# Patient Record
Sex: Male | Born: 1940
Health system: Southern US, Community
[De-identification: ages and names within clinical notes are randomized; demographics above are authoritative.]

## PROBLEM LIST (undated history)

## (undated) DIAGNOSIS — N400 Enlarged prostate without lower urinary tract symptoms: Secondary | ICD-10-CM

## (undated) DIAGNOSIS — I10 Essential (primary) hypertension: Secondary | ICD-10-CM

## (undated) DIAGNOSIS — K219 Gastro-esophageal reflux disease without esophagitis: Secondary | ICD-10-CM

## (undated) DIAGNOSIS — E119 Type 2 diabetes mellitus without complications: Secondary | ICD-10-CM

## (undated) DIAGNOSIS — N200 Calculus of kidney: Secondary | ICD-10-CM

## (undated) DIAGNOSIS — J349 Unspecified disorder of nose and nasal sinuses: Secondary | ICD-10-CM

## (undated) DIAGNOSIS — E785 Hyperlipidemia, unspecified: Secondary | ICD-10-CM

## (undated) HISTORY — PX: LITHOTRIPSY: SUR834

---

## 1998-03-19 ENCOUNTER — Encounter: Payer: Self-pay | Admitting: Urology

## 1998-03-19 ENCOUNTER — Emergency Department (HOSPITAL_COMMUNITY): Admission: EM | Admit: 1998-03-19 | Discharge: 1998-03-19 | Payer: Self-pay | Admitting: Emergency Medicine

## 1998-03-19 ENCOUNTER — Ambulatory Visit (HOSPITAL_COMMUNITY): Admission: RE | Admit: 1998-03-19 | Discharge: 1998-03-19 | Payer: Self-pay | Admitting: Urology

## 1998-07-02 ENCOUNTER — Ambulatory Visit (HOSPITAL_COMMUNITY): Admission: RE | Admit: 1998-07-02 | Discharge: 1998-07-02 | Payer: Self-pay | Admitting: Urology

## 1998-07-02 ENCOUNTER — Encounter: Payer: Self-pay | Admitting: Urology

## 2001-05-03 ENCOUNTER — Encounter: Payer: Self-pay | Admitting: Urology

## 2001-05-03 ENCOUNTER — Ambulatory Visit (HOSPITAL_BASED_OUTPATIENT_CLINIC_OR_DEPARTMENT_OTHER): Admission: RE | Admit: 2001-05-03 | Discharge: 2001-05-03 | Payer: Self-pay | Admitting: Urology

## 2001-06-14 ENCOUNTER — Encounter: Payer: Self-pay | Admitting: Urology

## 2001-06-14 ENCOUNTER — Ambulatory Visit (HOSPITAL_BASED_OUTPATIENT_CLINIC_OR_DEPARTMENT_OTHER): Admission: RE | Admit: 2001-06-14 | Discharge: 2001-06-14 | Payer: Self-pay | Admitting: Urology

## 2002-12-30 ENCOUNTER — Encounter: Admission: RE | Admit: 2002-12-30 | Discharge: 2002-12-30 | Payer: Self-pay | Admitting: Internal Medicine

## 2004-02-09 ENCOUNTER — Encounter: Admission: RE | Admit: 2004-02-09 | Discharge: 2004-05-09 | Payer: Self-pay | Admitting: Internal Medicine

## 2005-02-07 ENCOUNTER — Emergency Department (HOSPITAL_COMMUNITY): Admission: EM | Admit: 2005-02-07 | Discharge: 2005-02-08 | Payer: Self-pay | Admitting: Emergency Medicine

## 2005-03-01 ENCOUNTER — Encounter: Admission: RE | Admit: 2005-03-01 | Discharge: 2005-03-01 | Payer: Self-pay | Admitting: Internal Medicine

## 2005-03-02 ENCOUNTER — Encounter: Admission: RE | Admit: 2005-03-02 | Discharge: 2005-03-02 | Payer: Self-pay | Admitting: Pediatrics

## 2011-02-02 DIAGNOSIS — E119 Type 2 diabetes mellitus without complications: Secondary | ICD-10-CM | POA: Diagnosis not present

## 2011-05-03 DIAGNOSIS — E119 Type 2 diabetes mellitus without complications: Secondary | ICD-10-CM | POA: Diagnosis not present

## 2011-05-05 DIAGNOSIS — H25049 Posterior subcapsular polar age-related cataract, unspecified eye: Secondary | ICD-10-CM | POA: Diagnosis not present

## 2011-06-17 DIAGNOSIS — N401 Enlarged prostate with lower urinary tract symptoms: Secondary | ICD-10-CM | POA: Diagnosis not present

## 2011-06-29 DIAGNOSIS — N401 Enlarged prostate with lower urinary tract symptoms: Secondary | ICD-10-CM | POA: Diagnosis not present

## 2011-06-29 DIAGNOSIS — N529 Male erectile dysfunction, unspecified: Secondary | ICD-10-CM | POA: Diagnosis not present

## 2011-11-04 DIAGNOSIS — I10 Essential (primary) hypertension: Secondary | ICD-10-CM | POA: Diagnosis not present

## 2011-11-04 DIAGNOSIS — E119 Type 2 diabetes mellitus without complications: Secondary | ICD-10-CM | POA: Diagnosis not present

## 2011-11-04 DIAGNOSIS — E7800 Pure hypercholesterolemia, unspecified: Secondary | ICD-10-CM | POA: Diagnosis not present

## 2011-11-15 DIAGNOSIS — E7800 Pure hypercholesterolemia, unspecified: Secondary | ICD-10-CM | POA: Diagnosis not present

## 2011-11-15 DIAGNOSIS — I10 Essential (primary) hypertension: Secondary | ICD-10-CM | POA: Diagnosis not present

## 2011-11-15 DIAGNOSIS — Z23 Encounter for immunization: Secondary | ICD-10-CM | POA: Diagnosis not present

## 2011-11-15 DIAGNOSIS — E119 Type 2 diabetes mellitus without complications: Secondary | ICD-10-CM | POA: Diagnosis not present

## 2012-02-01 HISTORY — PX: OTHER SURGICAL HISTORY: SHX169

## 2012-05-10 DIAGNOSIS — E7800 Pure hypercholesterolemia, unspecified: Secondary | ICD-10-CM | POA: Diagnosis not present

## 2012-05-10 DIAGNOSIS — E119 Type 2 diabetes mellitus without complications: Secondary | ICD-10-CM | POA: Diagnosis not present

## 2012-05-10 DIAGNOSIS — I10 Essential (primary) hypertension: Secondary | ICD-10-CM | POA: Diagnosis not present

## 2012-05-23 DIAGNOSIS — H25049 Posterior subcapsular polar age-related cataract, unspecified eye: Secondary | ICD-10-CM | POA: Diagnosis not present

## 2012-05-30 DIAGNOSIS — R0989 Other specified symptoms and signs involving the circulatory and respiratory systems: Secondary | ICD-10-CM | POA: Diagnosis not present

## 2012-05-30 DIAGNOSIS — R0609 Other forms of dyspnea: Secondary | ICD-10-CM | POA: Diagnosis not present

## 2012-06-19 DIAGNOSIS — H2510 Age-related nuclear cataract, unspecified eye: Secondary | ICD-10-CM | POA: Diagnosis not present

## 2012-06-29 DIAGNOSIS — N401 Enlarged prostate with lower urinary tract symptoms: Secondary | ICD-10-CM | POA: Diagnosis not present

## 2012-07-16 DIAGNOSIS — H2510 Age-related nuclear cataract, unspecified eye: Secondary | ICD-10-CM | POA: Diagnosis not present

## 2012-07-16 DIAGNOSIS — H25049 Posterior subcapsular polar age-related cataract, unspecified eye: Secondary | ICD-10-CM | POA: Diagnosis not present

## 2012-07-24 DIAGNOSIS — H2510 Age-related nuclear cataract, unspecified eye: Secondary | ICD-10-CM | POA: Diagnosis not present

## 2012-07-30 DIAGNOSIS — H25049 Posterior subcapsular polar age-related cataract, unspecified eye: Secondary | ICD-10-CM | POA: Diagnosis not present

## 2012-07-30 DIAGNOSIS — H2510 Age-related nuclear cataract, unspecified eye: Secondary | ICD-10-CM | POA: Diagnosis not present

## 2012-08-08 DIAGNOSIS — E7800 Pure hypercholesterolemia, unspecified: Secondary | ICD-10-CM | POA: Diagnosis not present

## 2012-08-08 DIAGNOSIS — E119 Type 2 diabetes mellitus without complications: Secondary | ICD-10-CM | POA: Diagnosis not present

## 2012-08-14 DIAGNOSIS — N400 Enlarged prostate without lower urinary tract symptoms: Secondary | ICD-10-CM | POA: Diagnosis not present

## 2012-08-14 DIAGNOSIS — I10 Essential (primary) hypertension: Secondary | ICD-10-CM | POA: Diagnosis not present

## 2012-08-14 DIAGNOSIS — E119 Type 2 diabetes mellitus without complications: Secondary | ICD-10-CM | POA: Diagnosis not present

## 2013-01-11 DIAGNOSIS — E119 Type 2 diabetes mellitus without complications: Secondary | ICD-10-CM | POA: Diagnosis not present

## 2013-02-11 DIAGNOSIS — E7800 Pure hypercholesterolemia, unspecified: Secondary | ICD-10-CM | POA: Diagnosis not present

## 2013-02-11 DIAGNOSIS — E119 Type 2 diabetes mellitus without complications: Secondary | ICD-10-CM | POA: Diagnosis not present

## 2013-02-14 DIAGNOSIS — N400 Enlarged prostate without lower urinary tract symptoms: Secondary | ICD-10-CM | POA: Diagnosis not present

## 2013-02-14 DIAGNOSIS — E785 Hyperlipidemia, unspecified: Secondary | ICD-10-CM | POA: Diagnosis not present

## 2013-02-14 DIAGNOSIS — I10 Essential (primary) hypertension: Secondary | ICD-10-CM | POA: Diagnosis not present

## 2013-02-14 DIAGNOSIS — E119 Type 2 diabetes mellitus without complications: Secondary | ICD-10-CM | POA: Diagnosis not present

## 2013-07-18 DIAGNOSIS — N401 Enlarged prostate with lower urinary tract symptoms: Secondary | ICD-10-CM | POA: Diagnosis not present

## 2013-07-18 DIAGNOSIS — N139 Obstructive and reflux uropathy, unspecified: Secondary | ICD-10-CM | POA: Diagnosis not present

## 2013-07-26 DIAGNOSIS — N139 Obstructive and reflux uropathy, unspecified: Secondary | ICD-10-CM | POA: Diagnosis not present

## 2013-07-26 DIAGNOSIS — N401 Enlarged prostate with lower urinary tract symptoms: Secondary | ICD-10-CM | POA: Diagnosis not present

## 2013-08-07 DIAGNOSIS — I10 Essential (primary) hypertension: Secondary | ICD-10-CM | POA: Diagnosis not present

## 2013-08-07 DIAGNOSIS — T6391XA Toxic effect of contact with unspecified venomous animal, accidental (unintentional), initial encounter: Secondary | ICD-10-CM | POA: Diagnosis not present

## 2013-08-09 DIAGNOSIS — E119 Type 2 diabetes mellitus without complications: Secondary | ICD-10-CM | POA: Diagnosis not present

## 2013-08-09 DIAGNOSIS — N400 Enlarged prostate without lower urinary tract symptoms: Secondary | ICD-10-CM | POA: Diagnosis not present

## 2013-11-21 DIAGNOSIS — J208 Acute bronchitis due to other specified organisms: Secondary | ICD-10-CM | POA: Diagnosis not present

## 2014-01-20 DIAGNOSIS — E119 Type 2 diabetes mellitus without complications: Secondary | ICD-10-CM | POA: Diagnosis not present

## 2014-02-11 DIAGNOSIS — R109 Unspecified abdominal pain: Secondary | ICD-10-CM | POA: Diagnosis not present

## 2014-02-11 DIAGNOSIS — N200 Calculus of kidney: Secondary | ICD-10-CM | POA: Diagnosis not present

## 2014-02-11 DIAGNOSIS — N202 Calculus of kidney with calculus of ureter: Secondary | ICD-10-CM | POA: Diagnosis not present

## 2014-02-11 DIAGNOSIS — N201 Calculus of ureter: Secondary | ICD-10-CM | POA: Diagnosis not present

## 2014-02-11 DIAGNOSIS — K8020 Calculus of gallbladder without cholecystitis without obstruction: Secondary | ICD-10-CM | POA: Diagnosis not present

## 2014-02-11 DIAGNOSIS — R312 Other microscopic hematuria: Secondary | ICD-10-CM | POA: Diagnosis not present

## 2014-02-11 DIAGNOSIS — R110 Nausea: Secondary | ICD-10-CM | POA: Diagnosis not present

## 2014-02-11 DIAGNOSIS — N1330 Unspecified hydronephrosis: Secondary | ICD-10-CM | POA: Diagnosis not present

## 2014-02-12 ENCOUNTER — Other Ambulatory Visit: Payer: Self-pay | Admitting: Urology

## 2014-02-12 DIAGNOSIS — E780 Pure hypercholesterolemia: Secondary | ICD-10-CM | POA: Diagnosis not present

## 2014-02-12 DIAGNOSIS — E118 Type 2 diabetes mellitus with unspecified complications: Secondary | ICD-10-CM | POA: Diagnosis not present

## 2014-02-12 DIAGNOSIS — I10 Essential (primary) hypertension: Secondary | ICD-10-CM | POA: Diagnosis not present

## 2014-02-14 NOTE — Patient Instructions (Addendum)
Ian [name]  02/14/2014   Your procedure is scheduled on: 02/20/14   Report to [name] Main  Entrance and follow signs to               [place] at 5:15 AM.   Call this number if you have problems the morning of surgery [phone]   Remember:  Do not eat food or drink liquids :After Midnight.     Take these medicines the morning of surgery with A SIP OF WATER: FINASTERIDE / MAY TAKE CLARITIN IF NEEDED                               You may not have any metal on your body including hair pins and              piercings  Do not wear jewelry, make-up, lotions, powders or perfumes.             Do not wear nail polish.  Do not shave  48 hours prior to surgery.              Men may shave face and neck.   Do not bring valuables to the hospital. [name].  Contacts, dentures or bridgework may not be worn into surgery.  Leave suitcase in the car. After surgery it may be brought to your room.     Patients discharged the day of surgery will not be allowed to drive home.  Name and phone number of your driver:  Special Instructions: N/A              Please read over the following fact sheets you were given: _____________________________________________________________________                                                     [name]  Before surgery, you can play an important role.  Because skin is not sterile, your skin needs to be as free of germs as possible.  You can reduce the number of germs on your skin by washing with CHG (chlorahexidine gluconate) soap before surgery.  CHG is an antiseptic cleaner which kills germs and bonds with the skin to continue killing germs even after washing. Please DO NOT use if you have an allergy to CHG or antibacterial soaps.  If your skin becomes reddened/irritated stop using the CHG and inform your nurse when you arrive at Short  Stay. Do not shave (including legs and underarms) for at least 48 hours prior to the first CHG shower.  You may shave your face. Please follow these instructions carefully:   1.  Shower with CHG Soap the night before surgery and the  morning of Surgery.   2.  If you choose to wash your hair, wash your hair first as usual with your  normal  Shampoo.   3.  After you shampoo, rinse your hair and body thoroughly to remove the  shampoo.  4.  Use CHG as you would any other liquid soap.  You can apply chg directly  to the skin and wash . Gently wash with scrungie or clean wascloth    5.  Apply the CHG Soap to your body ONLY FROM THE NECK DOWN.   Do not use on open                           Wound or open sores. Avoid contact with eyes, ears mouth and genitals (private parts).                        Genitals (private parts) with your normal soap.              6.  Wash thoroughly, paying special attention to the area where your surgery  will be performed.   7.  Thoroughly rinse your body with warm water from the neck down.   8.  DO NOT shower/wash with your normal soap after using and rinsing off  the CHG Soap .                9.  Pat yourself dry with a clean towel.             10.  Wear clean pajamas.             11.  Place clean sheets on your bed the night of your first shower and do not  sleep with pets.  Day of Surgery : Do not apply any lotions/deodorants the morning of surgery.  Please wear clean clothes to the hospital/surgery center.  FAILURE TO FOLLOW THESE INSTRUCTIONS MAY RESULT IN THE CANCELLATION OF YOUR SURGERY    PATIENT SIGNATURE_________________________________  ______________________________________________________________________

## 2014-02-17 ENCOUNTER — Other Ambulatory Visit: Payer: Self-pay

## 2014-02-17 ENCOUNTER — Encounter (HOSPITAL_COMMUNITY)
Admission: RE | Admit: 2014-02-17 | Discharge: 2014-02-17 | Disposition: A | Discharge status: Home / Self Care | Payer: Medicare Other | Source: Ambulatory Visit | Attending: Urology | Admitting: Urology

## 2014-02-17 ENCOUNTER — Encounter (HOSPITAL_COMMUNITY): Payer: Self-pay

## 2014-02-17 ENCOUNTER — Ambulatory Visit (HOSPITAL_COMMUNITY)
Admission: RE | Admit: 2014-02-17 | Discharge: 2014-02-17 | Disposition: A | Discharge status: Home / Self Care | Payer: Medicare Other | Source: Ambulatory Visit | Attending: Urology | Admitting: Urology

## 2014-02-17 DIAGNOSIS — Z01818 Encounter for other preprocedural examination: Secondary | ICD-10-CM | POA: Diagnosis not present

## 2014-02-17 DIAGNOSIS — I10 Essential (primary) hypertension: Secondary | ICD-10-CM

## 2014-02-17 DIAGNOSIS — N200 Calculus of kidney: Secondary | ICD-10-CM | POA: Diagnosis not present

## 2014-02-17 HISTORY — DX: Benign prostatic hyperplasia without lower urinary tract symptoms: N40.0

## 2014-02-17 HISTORY — DX: Unspecified disorder of nose and nasal sinuses: J34.9

## 2014-02-17 HISTORY — DX: Calculus of kidney: N20.0

## 2014-02-17 HISTORY — DX: Type 2 diabetes mellitus without complications: E11.9

## 2014-02-17 HISTORY — DX: Gastro-esophageal reflux disease without esophagitis: K21.9

## 2014-02-17 HISTORY — DX: Essential (primary) hypertension: I10

## 2014-02-17 HISTORY — DX: Hyperlipidemia, unspecified: E78.5

## 2014-02-17 LAB — CBC
HCT: 42.4 % (ref 39.0–52.0)
Hemoglobin: 14.2 g/dL (ref 13.0–17.0)
MCH: 30.6 pg (ref 26.0–34.0)
MCHC: 33.5 g/dL (ref 30.0–36.0)
MCV: 91.4 fL (ref 78.0–100.0)
PLATELETS: 184 10*3/uL (ref 150–400)
RBC: 4.64 MIL/uL (ref 4.22–5.81)
RDW: 12.7 % (ref 11.5–15.5)
WBC: 8.5 10*3/uL (ref 4.0–10.5)

## 2014-02-17 LAB — BASIC METABOLIC PANEL
Anion gap: 7 (ref 5–15)
BUN: 19 mg/dL (ref 6–23)
CO2: 27 mmol/L (ref 19–32)
Calcium: 9.7 mg/dL (ref 8.4–10.5)
Chloride: 106 mEq/L (ref 96–112)
Creatinine, Ser: 0.89 mg/dL (ref 0.50–1.35)
GFR calc Af Amer: >90 mL/min (ref >90)
GFR calc non Af Amer: 83 mL/min — ABNORMAL LOW (ref >90)
GLUCOSE: 101 mg/dL — AB (ref 70–99)
POTASSIUM: 4 mmol/L (ref 3.5–5.1)
SODIUM: 140 mmol/L (ref 135–145)

## 2014-02-17 NOTE — Progress Notes (Signed)
   02/17/14 0927  OBSTRUCTIVE SLEEP APNEA  Have you ever been diagnosed with sleep apnea through a sleep study? No  Do you snore loudly (loud enough to be heard through closed doors)?  0  Do you often feel tired, fatigued, or sleepy during the daytime? 0  Has anyone observed you stop breathing during your sleep? 1  Do you have, or are you being treated for high blood pressure? 1  BMI more than 35 kg/m2? 0  Age over 74 years old? 1  Neck circumference greater than 40 cm/16 inches? 1  Gender: 1  Obstructive Sleep Apnea Score 5  Score 4 or greater  Results sent to PCP

## 2014-02-18 DIAGNOSIS — E1165 Type 2 diabetes mellitus with hyperglycemia: Secondary | ICD-10-CM | POA: Diagnosis not present

## 2014-02-18 DIAGNOSIS — Z23 Encounter for immunization: Secondary | ICD-10-CM | POA: Diagnosis not present

## 2014-02-18 DIAGNOSIS — E780 Pure hypercholesterolemia: Secondary | ICD-10-CM | POA: Diagnosis not present

## 2014-02-18 DIAGNOSIS — I10 Essential (primary) hypertension: Secondary | ICD-10-CM | POA: Diagnosis not present

## 2014-02-19 NOTE — Anesthesia Preprocedure Evaluation (Signed)
Anesthesia Evaluation  Patient identified by MRN, date of birth, ID band Patient awake    Reviewed: Allergy & Precautions, NPO status , Patient's Chart, lab work & pertinent test results  History of Anesthesia Complications Negative for: history of anesthetic complications  Airway Mallampati: II  TM Distance: >3 FB Neck ROM: Full    Dental no notable dental hx. (+) Dental Advisory Given   Pulmonary neg pulmonary ROS,  breath sounds clear to auscultation  Pulmonary exam normal       Cardiovascular hypertension, Pt. on medications Rhythm:Regular Rate:Normal     Neuro/Psych negative neurological ROS  negative psych ROS   GI/Hepatic Neg liver ROS, GERD-  Medicated and Controlled,  Endo/Other  diabetes  Renal/GU Renal disease  negative genitourinary   Musculoskeletal negative musculoskeletal ROS (+)   Abdominal   Peds negative pediatric ROS (+)  Hematology negative hematology ROS (+)   Anesthesia Other Findings   Reproductive/Obstetrics negative OB ROS                             Anesthesia Physical Anesthesia Plan  ASA: II  Anesthesia Plan: General   Post-op Pain Management:    Induction: Intravenous  Airway Management Planned: LMA  Additional Equipment:   Intra-op Plan:   Post-operative Plan: Extubation in OR  Informed Consent: I have reviewed the patients History and Physical, chart, labs and discussed the procedure including the risks, benefits and alternatives for the proposed anesthesia with the patient or authorized representative who has indicated his/her understanding and acceptance.   Dental advisory given  Plan Discussed with: CRNA  Anesthesia Plan Comments:         Anesthesia Quick Evaluation

## 2014-02-19 NOTE — H&P (Signed)
Reason For Visit Seen today as a work-in for (L) flank pain.   Active Problems Problems  1. Calculus of left ureter (N20.1)   Assessed By: Jimmey Ralph (Urology); Last Assessed: 11 Feb 2014 2. Left flank pain (R10.9)   Assessed By: Jimmey Ralph (Urology); Last Assessed: 11 Feb 2014 3. Microscopic hematuria (R31.2)   Assessed By: Jimmey Ralph (Urology); Last Assessed: 11 Feb 2014 4. Nausea (R11.0)   Assessed By: Jimmey Ralph (Urology); Last Assessed: 11 Feb 2014 Ian. Nephrolithiasis (N20.0)   Assessed By: Jimmey Ralph (Urology); Last Assessed: 11 Feb 2014  History of Present Illness 74 YO male Ian [name] of Dr. Lynne Logan seen today as a work-in for (L) flank pain.     GU Hx:    1) BPH/LUTS: He has been treated successfully with combination medical therapy. He did try to discontinue his alpha blocker in 2009 but found his symptoms were significantly worse without this medication.   Current treatment: Flomax, Finasteride    2) Prostate cancer screening:  Last PSA: 0.6 (May 2014) -- on finasteride    3) Erectile dysfunction:  Current treatment: Cialis 20 mg    4) Questionable testosterone deficiency: He developed symptoms in 2009 including increased fatigue and loss of muscle mass. His libido was preserved. His testosterone at that time was normal at 368 ng/dl.    Interval history June 2015:    He follows up for further evaluation of his BPH and voiding symptoms as well as for prostate cancer screening. He continues to take finasteride and tamsulosin. He did temporarily need to stop tamsulosin when he was on back order and we substituted Rapaflo which he actually found to be less effective than tamsulosin. Currently, his symptoms remain subjectively stable over the past year. IPSS today is 11.     Jan 2016 Interval Hx:   Last pm stated "he just didn't feel well." Self treated with OTC Ibuprofen 200 mg and this am was awaken with (L) flank pain radiating into  (L) abdomen with mild nausea. Pain now 4-Ian/10. Denies hematuria or f/c.     Has hx of stones but states it has been years since last stone activity.   Past Medical History Problems  1. Benign localized prostatic hyperplasia with lower urinary tract symptoms (LUTS) (N40.1) 2. History of diabetes mellitus (Z86.39) 3. History of hypercholesterolemia (Z86.39) 4. History of hypertension (Z86.79) Ian. Nephrolithiasis (N20.0)  Surgical History Problems  1. History of Lithotripsy  Current Meds 1. Aspirin 81 MG Oral Tablet;  Therapy: (Recorded:29Jan2008) to Recorded 2. Cialis 20 MG Oral Tablet; TAKE 1 TABLET As Directed;  Therapy: 04Apr2012 to (Last Rx:04Apr2012) Ordered 3. Claritin 10 MG Oral Tablet;  Therapy: (Recorded:29Jan2008) to Recorded 4. Crestor TABS;  Therapy: (Recorded:29Jul2008) to Recorded Ian. Finasteride Ian MG Oral Tablet; TAKE 1 TABLET DAILY AS DIRECTED;  Therapy: 29Jan2008 to (Evaluate:18Sep2016)  Requested for: [id number]; Last  [id number] Ordered 6. Lisinopril 10 MG Oral Tablet;  Therapy: [id number] to Recorded 7. MetFORMIN HCl - 500 MG Oral Tablet;  Therapy: 25Mar2012 to Recorded 8. Pataday 0.2 % Ophthalmic Solution;  Therapy: [id number] to Recorded 9. Tamsulosin HCl - 0.4 MG Oral Capsule; TAKE 1 CAPSULE AT BEDTIME;  Therapy: [id number] to (Evaluate:30Sep2016)  Requested for: [id number]; Last  Rx:04Jan2016 Ordered  Allergies Medication  1. Lipitor TABS  Family History Problems  1. No pertinent family history : Mother  Social History Problems  1. Denied: History of Alcohol Use 2. Marital History - Currently Married 3. Never A Smoker 4. Occupation:  retired Ian. Denied: History of Tobacco Use  Review of Systems Genitourinary, constitutional, skin, eye, otolaryngeal, hematologic/lymphatic, cardiovascular, pulmonary, endocrine, musculoskeletal, gastrointestinal, neurological and psychiatric system(s) were reviewed and pertinent findings if present are noted  and are otherwise negative.  Gastrointestinal: nausea, flank pain and abdominal pain.    Vitals Vital Signs [Data Includes: Last 1 Day]  Recorded: 12Jan2016 11:27AM  Height: Ian ft 11 in Weight: 168 lb  BMI Calculated: 23.43 BSA Calculated: 1.96 Blood Pressure: 138 / 68 Temperature: 97.9 F Heart Rate: 96  Physical Exam Constitutional: Well nourished and well developed . No acute distress. The Ian [name] appears well hydrated.  Abdomen: The abdomen is flat. The abdomen is soft and nontender. No tenderness in the RLQ and no LLQ tenderness. No CVA tenderness.  Skin: Normal skin turgor.  Neuro/Psych:. Mood and affect are appropriate.    Results/Data Urine [Data Includes: Last 1 Day]   [id number]  COLOR YELLOW   APPEARANCE CLEAR   SPECIFIC GRAVITY 1.020   pH 6.0   GLUCOSE NEG mg/dL  BILIRUBIN NEG   KETONE NEG mg/dL  BLOOD LARGE   PROTEIN NEG mg/dL  UROBILINOGEN 0.2 mg/dL  NITRITE NEG   LEUKOCYTE ESTERASE NEG   SQUAMOUS EPITHELIAL/HPF RARE   WBC NONE SEEN WBC/hpf  RBC 21-50 RBC/hpf  BACTERIA NONE SEEN   CRYSTALS NONE SEEN   CASTS NONE SEEN    The following images/tracing/specimen were independently visualized:  CT urogram: bilateral renal calculi with stone burden L>R. Has bilateral perinephric stranding. Mild left hydronephrosis and hydroureter noted. There are 2 stones noted along left ureter (1) 4 mm mid ureter (2) >10 mm UVJ calculus noted.  The following clinical lab reports were reviewed:  UA- significant microscopic hematuria. Selected Results  RENAL FUNCTION PANEL 12Jan2016 12:28PM Jimmey Ralph  SPECIMEN TYPE: BLOOD   Test Name Result Flag Reference  GLUCOSE 106 mg/dL H 70-99  BUN 25 mg/dL H 6-23  CREATININE 1.23 mg/dL  0.50-1.50  SODIUM 140 mEq/L  135-145  POTASSIUM 4.3 mEq/L  3.Ian-Ian.3  CHLORIDE 104 mEq/L  96-112  CO2 24 mEq/L  19-32  CALCIUM 9.9 mg/dL  8.4-10.Ian  PHOSPHORUS 3.7 mg/dL  2.3-4.6  ALBUMIN 4.0 g/dL  3.Ian-Ian.2  Est GFR, African American 67 mL/min     Est GFR, NonAfrican American 58 mL/min L   THE ESTIMATED GFR IS A CALCULATION VALID FOR ADULTS (>=2 YEARS OLD) THAT USES THE CKD-EPI ALGORITHM TO ADJUST FOR AGE AND SEX. IT IS   NOT TO BE USED FOR CHILDREN, PREGNANT WOMEN, HOSPITALIZED PATIENTS,    PATIENTS ON DIALYSIS, OR WITH RAPIDLY CHANGING KIDNEY FUNCTION. ACCORDING TO THE NKDEP, EGFR >89 IS NORMAL, 60-89 SHOWS MILD IMPAIRMENT, 30-59 SHOWS MODERATE IMPAIRMENT, 15-29 SHOWS SEVERE IMPAIRMENT AND <15 IS ESRD.   AU CT-STONE PROTOCOL [id number] 12:00AM Jimmey Ralph   Test Name Result Flag Reference  CT-STONE PROTOCOL (Report)    ** RADIOLOGY REPORT BY [facility] **   CLINICAL DATA: Microhematuria. Left flank pain starting this morning. History of renal calculi.  EXAM: CT ABDOMEN AND PELVIS WITHOUT CONTRAST (URINARY CALCULUS PROTOCOL)  TECHNIQUE: Multidetector CT imaging was performed through the abdomen and pelvis without intravenous contrast to include the urinary tract.  COMPARISON: 03/01/2005  FINDINGS: Lower chest: Chronic scarring in the right lower lobe.  Hepatobiliary: 4 mm gallstone observed in the gallbladder.  Pancreas: Unremarkable  Spleen: Unremarkable  Adrenals/Urinary Tract: 8 mm right kidney lower pole nonobstructive calculus.  Left hydronephrosis with hydroureter extending down to a 1.1 cm left UVJ  calculus. There is also a 6 mm calculus in the left ureter at the L4 vertebral level. Nonobstructive left-sided renal calculi include a 1.3 cm upper pole nonobstructive calculus as well as several other calculi in the 2-Ian mm range. No bladder calculi observed. Perirenal stranding is present bilaterally, slightly more prominent on the left.  Stomach/Bowel: Unremarkable  Vascular/Lymphatic: Aortoiliac atherosclerotic vascular disease. Infrarenal abdominal aortic aneurysm 3.Ian cm anterior- posterior by 3.0 cm transverse, previously 2.9 by 2.9 cm when measured in the same fashion back in  2007.  Reproductive: Mildly enlarged prostate gland with marginal calcifications.  Other: No supplemental non-categorized findings.  Musculoskeletal: Bilateral inguinal hernias are present, containing fat and (on the right side) a loop of small bowel without complication or strangulation. Facet arthropathy at L5-S1 with posterior osseous ridging.  IMPRESSION: 1. 1.1 cm left UVJ calculus causing moderate left hydronephrosis and hydroureter. There is also a separate 6 mm calculus in the left ureter at the L4 vertebral level vertically. Nonobstructive bilateral renal calculi are present, more on the left than the right. 2. Chololithiasis. 3. Infrarenal abdominal aortic aneurysm measuring up to 3.Ian cm anterior-posterior. Recommend followup by ultrasound in 2 years. This recommendation follows ACR consensus guidelines: White Paper of the ACR Incidental Findings Committee II on Vascular Findings. J Am Coll Radiol 2013; 10:789-794. 4. Small bilateral inguinal hernia ; the right hernia contains a loop of small bowel without complicating feature.   Electronically Signed  By: Sherryl Barters M.D.  On: 02/11/2014 16:12   Assessment Assessed  1. Microscopic hematuria (R31.2) 2. Left flank pain (R10.9) 3. Nausea (R11.0) 4. Calculus of left ureter (N20.1) Ian. Nephrolithiasis (N20.0)  Plan  Calculus of left ureter  1. Start: Hydrocodone-Acetaminophen 7.Ian-325 MG Oral Tablet; TAKE 1 TO 2 TABLETS  EVERY 4 TO 6 HOURS AS NEEDED FOR PAIN 2. VENIPUNCTURE; Status:Complete;   Done: [id number] Left flank pain  3. Administered: Ketorolac Tromethamine 30 MG/ML Injection Solution Nausea  4. Start: Promethazine HCl - 12.Ian MG Oral Tablet; take 1 po Q6hrs prn  Ketorolac 30 mg IM today  Discussed with Dr. Alinda Money. He will review CT results and call pt later today to discuss intervention. Pt is leaving 03/28/14 for [place] X 18 days  Renal function today  Will continue with daily  Tamsulosin  Hydrocodone 7.Ian/325 mg 1-2 po Q4-6 hrs prn #40/0RF  Promethazine 12.Ian mg 1 po Q6 hrs prn #10/0RF  Instructed to begin straining urine. If stone captured bring to office. Instructed to contact office if he has any acute changes ie temp >100.Ian, intractable pain or n/v.   AU CT-STONE PROTOCOL; Status:Resulted - Requires Verification;  Done: [id number] 12:00AM Due:14Jan2016; Marked Important;Ordered; Today;  VWP:VXYI flank pain, Microscopic hematuria, Nephrolithiasis; Ordered AX:KPVVZS, Diane;  RENAL FUNCTION PANEL; Status:Resulted - Requires Verification;  Done: [id number] 12:00AM Due:14Jan2016; Marked Important;Ordered; Today;  QGB:EEFEOFHQ of left ureter; Ordered RF:XJOITG, Diane;   Discussion/Summary cc: Dr. Herbert Spires     Amendment  I have reviewed the Ian [name]'s CT scan and discussed in detail with Jimmey Ralph, [facility]. I spoke with the Ian [name] today and discussed the situation in detail. Considering his large burden ureteral calculi, he is unlikely to pass the stones. Furthermore, he has multiple stones. For this reason, he is not an ideal candidate for shockwave lithotripsy. We discussed ureteroscopic laser lithotripsy as his best option and discussed the alternative of percutaneous therapy which is likely more invasive therapy then he needs. We reviewed the potential risks, complications, and expected recovery processes as well  as the need for postoperative stent following cystoscopy, left retrograde pyelography, left ureteroscopic laser lithotripsy, and left ureteral stent placement. He gives his informed consent to proceed and this will be scheduled for the future.1     1 Amended By: Raynelle Bring; Feb 12 2014 12:34 PM EST  Signatures Electronically signed by : Jimmey Ralph, Trey Paula; Feb 12 2014  7:08AM EST Electronically signed by : Raynelle Bring, M.D.; Feb 12 2014 12:35PM EST

## 2014-02-20 ENCOUNTER — Encounter (HOSPITAL_COMMUNITY): Payer: Self-pay | Admitting: *Deleted

## 2014-02-20 ENCOUNTER — Encounter (HOSPITAL_COMMUNITY)
Admission: RE | Disposition: A | Discharge status: Home / Self Care | Payer: Self-pay | Source: Ambulatory Visit | Attending: Urology

## 2014-02-20 ENCOUNTER — Ambulatory Visit (HOSPITAL_COMMUNITY): Payer: Medicare Other | Admitting: Anesthesiology

## 2014-02-20 ENCOUNTER — Ambulatory Visit (HOSPITAL_COMMUNITY)
Admission: RE | Admit: 2014-02-20 | Discharge: 2014-02-20 | Disposition: A | Discharge status: Home / Self Care | Payer: Medicare Other | Source: Ambulatory Visit | Attending: Urology | Admitting: Urology

## 2014-02-20 DIAGNOSIS — E780 Pure hypercholesterolemia: Secondary | ICD-10-CM | POA: Insufficient documentation

## 2014-02-20 DIAGNOSIS — Z79899 Other long term (current) drug therapy: Secondary | ICD-10-CM | POA: Insufficient documentation

## 2014-02-20 DIAGNOSIS — K4020 Bilateral inguinal hernia, without obstruction or gangrene, not specified as recurrent: Secondary | ICD-10-CM | POA: Diagnosis not present

## 2014-02-20 DIAGNOSIS — R110 Nausea: Secondary | ICD-10-CM | POA: Insufficient documentation

## 2014-02-20 DIAGNOSIS — K219 Gastro-esophageal reflux disease without esophagitis: Secondary | ICD-10-CM | POA: Insufficient documentation

## 2014-02-20 DIAGNOSIS — I714 Abdominal aortic aneurysm, without rupture: Secondary | ICD-10-CM | POA: Diagnosis not present

## 2014-02-20 DIAGNOSIS — Z87891 Personal history of nicotine dependence: Secondary | ICD-10-CM | POA: Diagnosis not present

## 2014-02-20 DIAGNOSIS — E119 Type 2 diabetes mellitus without complications: Secondary | ICD-10-CM | POA: Insufficient documentation

## 2014-02-20 DIAGNOSIS — Z7982 Long term (current) use of aspirin: Secondary | ICD-10-CM | POA: Diagnosis not present

## 2014-02-20 DIAGNOSIS — N189 Chronic kidney disease, unspecified: Secondary | ICD-10-CM | POA: Insufficient documentation

## 2014-02-20 DIAGNOSIS — N132 Hydronephrosis with renal and ureteral calculous obstruction: Secondary | ICD-10-CM | POA: Insufficient documentation

## 2014-02-20 DIAGNOSIS — N211 Calculus in urethra: Secondary | ICD-10-CM | POA: Diagnosis not present

## 2014-02-20 DIAGNOSIS — N201 Calculus of ureter: Secondary | ICD-10-CM | POA: Diagnosis not present

## 2014-02-20 DIAGNOSIS — N200 Calculus of kidney: Secondary | ICD-10-CM | POA: Diagnosis not present

## 2014-02-20 DIAGNOSIS — I129 Hypertensive chronic kidney disease with stage 1 through stage 4 chronic kidney disease, or unspecified chronic kidney disease: Secondary | ICD-10-CM | POA: Diagnosis not present

## 2014-02-20 HISTORY — PX: CYSTOSCOPY WITH RETROGRADE PYELOGRAM, URETEROSCOPY AND STENT PLACEMENT: SHX5789

## 2014-02-20 HISTORY — PX: HOLMIUM LASER APPLICATION: SHX5852

## 2014-02-20 LAB — GLUCOSE, CAPILLARY
GLUCOSE-CAPILLARY: 92 mg/dL (ref 70–99)
Glucose-Capillary: 99 mg/dL (ref 70–99)

## 2014-02-20 SURGERY — CYSTOURETEROSCOPY, WITH RETROGRADE PYELOGRAM AND STENT INSERTION
Anesthesia: General | Laterality: Left

## 2014-02-20 MED ORDER — FENTANYL CITRATE 0.05 MG/ML IJ SOLN
INTRAMUSCULAR | Status: AC
  Filled 2014-02-20: qty 5

## 2014-02-20 MED ORDER — ATROPINE SULFATE 0.4 MG/ML IJ SOLN
INTRAMUSCULAR | Status: AC
  Filled 2014-02-20: qty 1

## 2014-02-20 MED ORDER — SODIUM CHLORIDE 0.9 % IR SOLN
Status: DC | PRN
  Administered 2014-02-20: 4000 mL

## 2014-02-20 MED ORDER — FENTANYL CITRATE 0.05 MG/ML IJ SOLN
INTRAMUSCULAR | Status: AC
  Filled 2014-02-20: qty 2

## 2014-02-20 MED ORDER — PHENAZOPYRIDINE HCL 100 MG PO TABS: 100.0000 mg | ORAL_TABLET | Freq: Three times a day (TID) | ORAL | Status: DC | PRN

## 2014-02-20 MED ORDER — ACETAMINOPHEN 10 MG/ML IV SOLN
1000.0000 mg | Freq: Once | INTRAVENOUS | Status: AC
  Administered 2014-02-20: 1000 mg via INTRAVENOUS
  Filled 2014-02-20: qty 100

## 2014-02-20 MED ORDER — PHENYLEPHRINE HCL 10 MG/ML IJ SOLN
INTRAMUSCULAR | Status: DC | PRN
  Administered 2014-02-20 (×4): 20 ug via INTRAVENOUS

## 2014-02-20 MED ORDER — ONDANSETRON HCL 4 MG/2ML IJ SOLN
INTRAMUSCULAR | Status: DC | PRN
  Administered 2014-02-20 (×2): 2 mg via INTRAVENOUS

## 2014-02-20 MED ORDER — ONDANSETRON HCL 4 MG/2ML IJ SOLN
INTRAMUSCULAR | Status: AC
  Filled 2014-02-20: qty 2

## 2014-02-20 MED ORDER — FENTANYL CITRATE 0.05 MG/ML IJ SOLN
INTRAMUSCULAR | Status: DC | PRN
  Administered 2014-02-20 (×2): 25 ug via INTRAVENOUS
  Administered 2014-02-20: 50 ug via INTRAVENOUS

## 2014-02-20 MED ORDER — PROPOFOL 10 MG/ML IV BOLUS
INTRAVENOUS | Status: AC
  Filled 2014-02-20: qty 20

## 2014-02-20 MED ORDER — LIDOCAINE HCL (CARDIAC) 20 MG/ML IV SOLN
INTRAVENOUS | Status: AC
  Filled 2014-02-20: qty 5

## 2014-02-20 MED ORDER — ONDANSETRON HCL 4 MG/2ML IJ SOLN: 4.0000 mg | Freq: Once | INTRAMUSCULAR | Status: DC | PRN

## 2014-02-20 MED ORDER — MIDAZOLAM HCL 5 MG/5ML IJ SOLN
INTRAMUSCULAR | Status: DC | PRN
  Administered 2014-02-20 (×2): 1 mg via INTRAVENOUS

## 2014-02-20 MED ORDER — LIDOCAINE HCL (CARDIAC) 20 MG/ML IV SOLN
INTRAVENOUS | Status: DC | PRN
  Administered 2014-02-20: 75 mg via INTRAVENOUS

## 2014-02-20 MED ORDER — PROPOFOL 10 MG/ML IV BOLUS
INTRAVENOUS | Status: DC | PRN
  Administered 2014-02-20: 150 mg via INTRAVENOUS

## 2014-02-20 MED ORDER — EPHEDRINE SULFATE 50 MG/ML IJ SOLN
INTRAMUSCULAR | Status: DC | PRN
  Administered 2014-02-20: 5 mg via INTRAVENOUS
  Administered 2014-02-20 (×2): 2.5 mg via INTRAVENOUS

## 2014-02-20 MED ORDER — HYDROCODONE-ACETAMINOPHEN 5-325 MG PO TABS: 1.0000 | ORAL_TABLET | Freq: Four times a day (QID) | ORAL | Status: DC | PRN

## 2014-02-20 MED ORDER — LACTATED RINGERS IV SOLN
INTRAVENOUS | Status: DC | PRN
  Administered 2014-02-20 (×2): via INTRAVENOUS

## 2014-02-20 MED ORDER — MIDAZOLAM HCL 2 MG/2ML IJ SOLN
INTRAMUSCULAR | Status: AC
  Filled 2014-02-20: qty 2

## 2014-02-20 MED ORDER — CIPROFLOXACIN IN D5W 400 MG/200ML IV SOLN
400.0000 mg | INTRAVENOUS | Status: AC
  Administered 2014-02-20: 400 mg via INTRAVENOUS

## 2014-02-20 MED ORDER — PHENYLEPHRINE 40 MCG/ML (10ML) SYRINGE FOR IV PUSH (FOR BLOOD PRESSURE SUPPORT)
PREFILLED_SYRINGE | INTRAVENOUS | Status: AC
  Filled 2014-02-20: qty 10

## 2014-02-20 MED ORDER — SODIUM CHLORIDE 0.9 % IJ SOLN
INTRAMUSCULAR | Status: AC
  Filled 2014-02-20: qty 10

## 2014-02-20 MED ORDER — EPHEDRINE SULFATE 50 MG/ML IJ SOLN
INTRAMUSCULAR | Status: AC
  Filled 2014-02-20: qty 1

## 2014-02-20 MED ORDER — CIPROFLOXACIN IN D5W 400 MG/200ML IV SOLN
INTRAVENOUS | Status: AC
  Filled 2014-02-20: qty 200

## 2014-02-20 MED ORDER — FENTANYL CITRATE 0.05 MG/ML IJ SOLN
25.0000 ug | INTRAMUSCULAR | Status: DC | PRN
  Administered 2014-02-20: 50 ug via INTRAVENOUS

## 2014-02-20 MED ORDER — IOHEXOL 300 MG/ML  SOLN
INTRAMUSCULAR | Status: DC | PRN
  Administered 2014-02-20: 10 mL

## 2014-02-20 SURGICAL SUPPLY — 21 items
BAG URO CATCHER STRL LF (DRAPE) ×3 IMPLANT
BASKET ZERO TIP NITINOL 2.4FR (BASKET) IMPLANT
CATH INTERMIT  6FR 70CM (CATHETERS) IMPLANT
CLOTH BEACON ORANGE TIMEOUT ST (SAFETY) ×3 IMPLANT
DRAPE CAMERA CLOSED 9X96 (DRAPES) IMPLANT
EXTRACTOR STONE NITINOL NGAGE (UROLOGICAL SUPPLIES) ×3 IMPLANT
FIBER LASER FLEXIVA 1000 (UROLOGICAL SUPPLIES) IMPLANT
FIBER LASER FLEXIVA 200 (UROLOGICAL SUPPLIES) IMPLANT
FIBER LASER FLEXIVA 365 (UROLOGICAL SUPPLIES) ×3 IMPLANT
FIBER LASER FLEXIVA 550 (UROLOGICAL SUPPLIES) IMPLANT
FIBER LASER TRAC TIP (UROLOGICAL SUPPLIES) IMPLANT
GLOVE BIOGEL M STRL SZ7.5 (GLOVE) ×3 IMPLANT
GOWN STRL REUS W/TWL LRG LVL3 (GOWN DISPOSABLE) ×6 IMPLANT
GUIDEWIRE ANG ZIPWIRE 038X150 (WIRE) IMPLANT
GUIDEWIRE STR DUAL SENSOR (WIRE) ×3 IMPLANT
MANIFOLD NEPTUNE II (INSTRUMENTS) ×3 IMPLANT
PACK CYSTO (CUSTOM PROCEDURE TRAY) ×3 IMPLANT
SHIELD EYE BINOCULAR (MISCELLANEOUS) IMPLANT
STENT CONTOUR 6FRX24X.038 (STENTS) ×3 IMPLANT
TUBING CONNECTING 10 (TUBING) ×2 IMPLANT
TUBING CONNECTING 10' (TUBING) ×1

## 2014-02-20 NOTE — Anesthesia Postprocedure Evaluation (Signed)
  Anesthesia Post-op Note  Patient: Ian [name]  Procedure(s) Performed: Procedure(s) (LRB): CYSTOSCOPY WITH RETROGRADE PYELOGRAM, URETEROSCOPY AND STENT PLACEMENT (Left) HOLMIUM LASER APPLICATION (Left)  Patient Location: PACU  Anesthesia Type: General  Level of Consciousness: awake and alert   Airway and Oxygen Therapy: Patient Spontanous Breathing  Post-op Pain: mild  Post-op Assessment: Post-op Vital signs reviewed, Patient's Cardiovascular Status Stable, Respiratory Function Stable, Patent Airway and No signs of Nausea or vomiting  Last Vitals:  Filed Vitals:   02/20/14 0945  BP: 130/67  Pulse: 65  Temp: 36.4 C  Resp: 12    Post-op Vital Signs: stable   Complications: No apparent anesthesia complications

## 2014-02-20 NOTE — Discharge Instructions (Signed)

## 2014-02-20 NOTE — Op Note (Signed)
Preoperative diagnosis: Left ureteral calculi  Postoperative diagnosis: Left ureteral calculi  Procedure:  1. Cystoscopy 2. Left ureteroscopy and stone removal 3. Ureteroscopic laser lithotripsy 4. Left ureteral stent placement (6 x 24)  5. Left retrograde pyelography with interpretation  Surgeon: Pryor Curia. M.D.  Anesthesia: General  Complications: None  Intraoperative findings: Left retrograde pyelography demonstrated filling defects within the distal left ureter consistent with the patient's known calculi without other abnormalities.  There also appeared to be a large filling defect in the renal pelvis consistent with his known left renal pelvic stone.  EBL: Minimal  Specimens: 1. Left ureteral calculi  Disposition of specimens: [facility] for stone analysis  Indication: Ian NICHOL is a 74 y.o. year old patient with two large left ureteral calculi. After reviewing the management options for treatment, the patient elected to proceed with the above surgical procedure(s). We have discussed the potential benefits and risks of the procedure, side effects of the proposed treatment, the likelihood of the patient achieving the goals of the procedure, and any potential problems that might occur during the procedure or recuperation. Informed consent has been obtained.  Description of procedure:  The patient was taken to the operating room and general anesthesia was induced.  The patient was placed in the dorsal lithotomy position, prepped and draped in the usual sterile fashion, and preoperative antibiotics were administered. A preoperative time-out was performed.   Cystourethroscopy was performed.  The patient's urethra was examined and demonstrated bilobar prostatic hypertrophy. The bladder was then systematically examined in its entirety. There was no evidence for any bladder tumors, stones, or other mucosal pathology.    Attention then turned to the  left ureteral orifice and a ureteral catheter was used to intubate the ureteral orifice.  Omnipaque contrast was injected through the ureteral catheter and a retrograde pyelogram was performed with findings as dictated above.  A 0.38 sensor guidewire was then advanced up the left ureter into the renal pelvis under fluoroscopic guidance. The 6.5 Fr semirigid ureteroscope was then advanced into the ureter next to the guidewire and the calculus was identified.   The stone was then fragmented with the 365 micron holmium laser fiber on a setting of 0.5 J and frequency of 20 Hz.   All stones were then removed from the ureter with an N gauge basket.  Reinspection of the ureter revealed no remaining visible stones or fragments.   The wire was then backloaded through the cystoscope and a ureteral stent was advance over the wire using Seldinger technique.  The stent was positioned appropriately under fluoroscopic and cystoscopic guidance.  The wire was then removed with an adequate stent curl noted in the renal pelvis as well as in the bladder.  The bladder was then emptied and the procedure ended.  The patient appeared to tolerate the procedure well and without complications.  The patient was able to be awakened and transferred to the recovery unit in satisfactory condition.

## 2014-02-20 NOTE — Transfer of Care (Signed)
Immediate Anesthesia Transfer of Care Note  Patient: Ian [name]  Procedure(s) Performed: Procedure(s): CYSTOSCOPY WITH RETROGRADE PYELOGRAM, URETEROSCOPY AND STENT PLACEMENT (Left) HOLMIUM LASER APPLICATION (Left)  Patient Location: PACU  Anesthesia Type:General  Level of Consciousness: awake, alert , oriented, patient cooperative and responds to stimulation  Airway & Oxygen Therapy: Patient Spontanous Breathing and Patient connected to face mask oxygen  Post-op Assessment: Report given to PACU RN, Post -op Vital signs reviewed and stable and Patient moving all extremities  Post vital signs: Reviewed and stable  Complications: No apparent anesthesia complications

## 2014-02-20 NOTE — Interval H&P Note (Signed)
History and Physical Interval Note:  02/20/2014 7:14 AM  Ian [name]  has presented today for surgery, with the diagnosis of LEFT URETERAL CALCULI  The various methods of treatment have been discussed with the patient and family. After consideration of risks, benefits and other options for treatment, the patient has consented to  Procedure(s): CYSTOSCOPY WITH RETROGRADE PYELOGRAM, URETEROSCOPY AND STENT PLACEMENT (Left) HOLMIUM LASER APPLICATION (Left) as a surgical intervention .  The patient's history has been reviewed, patient examined, no change in status, stable for surgery.  I have reviewed the patient's chart and labs.  Questions were answered to the patient's satisfaction.     Trystan Akhtar,LES

## 2014-02-24 ENCOUNTER — Encounter (HOSPITAL_COMMUNITY): Payer: Self-pay | Admitting: Urology

## 2014-02-24 DIAGNOSIS — N201 Calculus of ureter: Secondary | ICD-10-CM | POA: Diagnosis not present

## 2014-02-28 DIAGNOSIS — N200 Calculus of kidney: Secondary | ICD-10-CM | POA: Diagnosis not present

## 2014-02-28 DIAGNOSIS — N201 Calculus of ureter: Secondary | ICD-10-CM | POA: Diagnosis not present

## 2014-03-07 ENCOUNTER — Other Ambulatory Visit: Payer: Self-pay | Admitting: Urology

## 2014-04-16 ENCOUNTER — Encounter (HOSPITAL_COMMUNITY): Payer: Self-pay | Admitting: *Deleted

## 2014-04-18 NOTE — H&P (Signed)
History of Present Illness [name] is a 74 year old with the following urologic history:  1) BPH/LUTS: He has been treated successfully with combination medical therapy. He did try to discontinue his alpha blocker in 2009 but found his symptoms were significantly worse without this medication.  Current treatment: Flomax, Finasteride  2) Prostate cancer screening: Last PSA: 0.68 (Jun 2015) -- on finasteride  3) Erectile dysfunction: Current treatment: Cialis 20 mg  4) Questionable testosterone deficiency: He developed symptoms in 2009 including increased fatigue and loss of muscle mass. His libido was preserved. His testosterone at that time was normal at 368 ng/dl.  5) Urolithiasis: He had a prior history of stone disease but initially presented to me in January 2016 with large burden left ureteral calculi and bilateral renal calculi. Jan 2016: L ureteroscopic laser lithotripsy for two large left ureteral calculi    Past Medical History Problems  1. Benign localized prostatic hyperplasia with lower urinary tract symptoms (LUTS) (N40.1) 2. History of diabetes mellitus (Z86.39) 3. History of hypercholesterolemia (Z86.39) 4. History of hypertension (Z86.79) 5. Nephrolithiasis (N20.0)  Surgical History Problems  1. History of Lithotripsy  Current Meds 1. Aspirin 81 MG Oral Tablet;  Therapy: (Recorded:29Jan2008) to Recorded 2. Cialis 20 MG Oral Tablet; TAKE 1 TABLET As Directed;  Therapy: 04Apr2012 to (Last Rx:04Apr2012) Ordered 3. Claritin 10 MG Oral Tablet;  Therapy: (Recorded:29Jan2008) to Recorded 4. Crestor TABS;  Therapy: (Recorded:29Jul2008) to Recorded 5. Finasteride 5 MG Oral Tablet; TAKE 1 TABLET DAILY AS DIRECTED;  Therapy: 29Jan2008 to (Evaluate:18Sep2016)  Requested for: [id number]; Last  [id number] Ordered 6. Hydrocodone-Acetaminophen 7.5-325 MG Oral Tablet; TAKE 1 TO 2 TABLETS EVERY 4  TO 6 HOURS AS NEEDED FOR PAIN;  Therapy: [id number] to  (Evaluate:16Jan2016); Last Rx:12Jan2016 Ordered 7. Lisinopril 10 MG Oral Tablet;  Therapy: [id number] to Recorded 8. MetFORMIN HCl - 500 MG Oral Tablet;  Therapy: 25Mar2012 to Recorded 9. Pataday 0.2 % Ophthalmic Solution;  Therapy: [id number] to Recorded 10. Promethazine HCl - 12.5 MG Oral Tablet; take 1 po Q6hrs prn;   Therapy: 25OIB7048 to (Last Rx:12Jan2016)  Requested for: 12Jan2016 Ordered 11. Tamsulosin HCl - 0.4 MG Oral Capsule; TAKE 1 CAPSULE AT BEDTIME;   Therapy: 88BVQ9450 to (Evaluate:30Sep2016)  Requested for: [id number]; Last   Rx:04Jan2016 Ordered  Allergies Medication  1. Lipitor TABS  Family History Problems  1. No pertinent family history : Mother  Social History Problems  1. Denied: History of Alcohol Use 2. Marital History - Currently Married 3. Never A Smoker 4. Occupation:   retired 68. Denied: History of Tobacco Use    Physical Exam Constitutional: Well nourished and well developed . No acute distress.  Abdomen: No CVA tenderness.      I independently reviewed his KUB x-ray. This demonstrates an indwelling left ureteral stent in appropriate position with adequate curls in the renal pelvis and within the bladder. He does have a large 1.5 cm left renal pelvic calculus. He also has a smaller calcification just superior to the stone measuring approximately 3 mm. In the right kidney there is a 5-6 mm calcification in the lower pole in a nonobstructing position.   Assessment  Assessed  1. Nephrolithiasis (N20.0)  Calculus of left ureter (592.1) (N20.1)    Discussion/Summary 1. Left ureteral calculi: The stones have been sent for analysis. He has recovered uneventfully from his recent procedure.  2. Left renal calculi: We reviewed options for treatment considering his stone size and understanding that he will not pass the stone. His  options would include percutaneous nephrolithotomy, ureteroscopic laser lithotripsy, and shockwave lithotripsy. We have  reviewed the pros and cons of each approach. He is most interested in proceeding with shock wave lithotripsy. He is tolerating his stent well and does have a trip out of the country planned for later in February. He would like to delay his shockwave lithotripsy until after his trip and would like to leave his stent indwelling which seems very appropriate considering his minimal symptoms associated with his indwelling stent. He will therefore be scheduled for left extracorporeal shockwave lithotripsy in March. We have reviewed potential risks including but not limited to bleeding, infection, damage or loss of the kidney, need for further procedures, etc. He understands that there is a greater chance that he might require secondary therapy was shockwave lithotripsy versus ureteroscopy with the benefit of potentially avoiding more invasive therapy.  3. Recurrent urolithiasis: He does have a right renal stone as well. Following treatment of his left renal calculus, we will plan to proceed with a full metabolic evaluation.

## 2014-04-21 ENCOUNTER — Encounter (HOSPITAL_COMMUNITY): Payer: Self-pay

## 2014-04-21 ENCOUNTER — Ambulatory Visit (HOSPITAL_COMMUNITY)
Admission: RE | Admit: 2014-04-21 | Discharge: 2014-04-21 | Disposition: A | Discharge status: Home / Self Care | Payer: Medicare Other | Source: Ambulatory Visit | Attending: Urology | Admitting: Urology

## 2014-04-21 ENCOUNTER — Ambulatory Visit (HOSPITAL_COMMUNITY): Payer: Medicare Other

## 2014-04-21 ENCOUNTER — Encounter (HOSPITAL_COMMUNITY)
Admission: RE | Disposition: A | Discharge status: Home / Self Care | Payer: Self-pay | Source: Ambulatory Visit | Attending: Urology

## 2014-04-21 DIAGNOSIS — E780 Pure hypercholesterolemia: Secondary | ICD-10-CM | POA: Insufficient documentation

## 2014-04-21 DIAGNOSIS — Z87442 Personal history of urinary calculi: Secondary | ICD-10-CM | POA: Diagnosis not present

## 2014-04-21 DIAGNOSIS — E119 Type 2 diabetes mellitus without complications: Secondary | ICD-10-CM | POA: Diagnosis not present

## 2014-04-21 DIAGNOSIS — N202 Calculus of kidney with calculus of ureter: Secondary | ICD-10-CM | POA: Insufficient documentation

## 2014-04-21 DIAGNOSIS — N200 Calculus of kidney: Secondary | ICD-10-CM

## 2014-04-21 DIAGNOSIS — I10 Essential (primary) hypertension: Secondary | ICD-10-CM | POA: Diagnosis not present

## 2014-04-21 DIAGNOSIS — Z7982 Long term (current) use of aspirin: Secondary | ICD-10-CM | POA: Diagnosis not present

## 2014-04-21 DIAGNOSIS — N529 Male erectile dysfunction, unspecified: Secondary | ICD-10-CM | POA: Diagnosis not present

## 2014-04-21 LAB — GLUCOSE, CAPILLARY
Glucose-Capillary: 67 mg/dL — ABNORMAL LOW (ref 70–99)
Glucose-Capillary: 159 mg/dL — ABNORMAL HIGH (ref 70–99)

## 2014-04-21 SURGERY — LITHOTRIPSY, ESWL
Anesthesia: LOCAL | Laterality: Left

## 2014-04-21 MED ORDER — DIPHENHYDRAMINE HCL 25 MG PO CAPS
25.0000 mg | ORAL_CAPSULE | ORAL | Status: AC
  Administered 2014-04-21: 25 mg via ORAL
  Filled 2014-04-21: qty 1

## 2014-04-21 MED ORDER — CIPROFLOXACIN HCL 500 MG PO TABS
500.0000 mg | ORAL_TABLET | ORAL | Status: AC
  Administered 2014-04-21: 500 mg via ORAL
  Filled 2014-04-21: qty 1

## 2014-04-21 MED ORDER — DEXTROSE-NACL 5-0.45 % IV SOLN
INTRAVENOUS | Status: DC
  Administered 2014-04-21: 14:00:00 via INTRAVENOUS

## 2014-04-21 MED ORDER — DIAZEPAM 5 MG PO TABS
10.0000 mg | ORAL_TABLET | ORAL | Status: AC
  Administered 2014-04-21: 10 mg via ORAL
  Filled 2014-04-21: qty 2

## 2014-04-21 MED ORDER — DEXTROSE 50 % IV SOLN
1.0000 | Freq: Once | INTRAVENOUS | Status: AC
  Administered 2014-04-21: 25 mL via INTRAVENOUS
  Filled 2014-04-21: qty 50

## 2014-04-21 NOTE — Op Note (Signed)
Please see scanned chart for op note for left ESWL.

## 2014-04-21 NOTE — Progress Notes (Signed)
D/c paperwork explained to pt and his wife.  Pt had snack, crackers and gingerale.  Vss, afebrile.  Pt voided large amount of bloody urine.  Pt will be d/c home with wife, wife is driving.

## 2014-04-21 NOTE — Progress Notes (Signed)
Per hypoglycemic protocol gave 68ml d50.  Will recheck in 87min.  Before d50 sugar was 67.  Pt also has ivf of d5 1/2ns running at this time.

## 2014-04-21 NOTE — Discharge Instructions (Signed)
1. You should strain your urine and collect all fragments and bring them to your follow up appointment.  °2. You should take your pain medication as needed.  Please call if your pain is severe to the point that it is not controlled with your pain medication. °3. You should call if you develop fever > 101 or persistent nausea or vomiting. °4. Your doctor may prescribe tamsulosin to take to help facilitate stone passage. °

## 2014-04-21 NOTE — Progress Notes (Addendum)
Hypoglycemic Event  CBG: 67  Treatment: D50 IV 25 mL  Symptoms: Hungry  Follow-up CBG: Time:1420 CBG Result:159  Possible Reasons for Event: Other: NPO  Comments/MD notified: pt is NPO for a lithotripsy.  Pt is a diabetic also, reason for sugar dropping due to NPO status, notified litho truck at 1448 of pt's sugars.    Evelia Waskey, Dahlia Byes  Remember to initiate Hypoglycemia Order Set & complete

## 2014-04-21 NOTE — Progress Notes (Signed)
Sugar is159 after 47ml d50.  Pt alert and arousable.  Pt had valium and benadryl at 1400 also.

## 2014-05-06 DIAGNOSIS — N200 Calculus of kidney: Secondary | ICD-10-CM | POA: Diagnosis not present

## 2014-05-06 DIAGNOSIS — N201 Calculus of ureter: Secondary | ICD-10-CM | POA: Diagnosis not present

## 2014-05-12 DIAGNOSIS — L0103 Bullous impetigo: Secondary | ICD-10-CM | POA: Diagnosis not present

## 2014-05-20 DIAGNOSIS — L0100 Impetigo, unspecified: Secondary | ICD-10-CM | POA: Diagnosis not present

## 2014-06-10 DIAGNOSIS — D225 Melanocytic nevi of trunk: Secondary | ICD-10-CM | POA: Diagnosis not present

## 2014-06-10 DIAGNOSIS — X32XXXA Exposure to sunlight, initial encounter: Secondary | ICD-10-CM | POA: Diagnosis not present

## 2014-06-10 DIAGNOSIS — L309 Dermatitis, unspecified: Secondary | ICD-10-CM | POA: Diagnosis not present

## 2014-06-10 DIAGNOSIS — L570 Actinic keratosis: Secondary | ICD-10-CM | POA: Diagnosis not present

## 2014-08-01 DIAGNOSIS — N401 Enlarged prostate with lower urinary tract symptoms: Secondary | ICD-10-CM | POA: Diagnosis not present

## 2014-08-01 DIAGNOSIS — N200 Calculus of kidney: Secondary | ICD-10-CM | POA: Diagnosis not present

## 2014-08-01 DIAGNOSIS — N209 Urinary calculus, unspecified: Secondary | ICD-10-CM | POA: Diagnosis not present

## 2014-08-01 DIAGNOSIS — R3916 Straining to void: Secondary | ICD-10-CM | POA: Diagnosis not present

## 2014-08-27 DIAGNOSIS — E780 Pure hypercholesterolemia: Secondary | ICD-10-CM | POA: Diagnosis not present

## 2014-08-27 DIAGNOSIS — Z125 Encounter for screening for malignant neoplasm of prostate: Secondary | ICD-10-CM | POA: Diagnosis not present

## 2014-08-27 DIAGNOSIS — I10 Essential (primary) hypertension: Secondary | ICD-10-CM | POA: Diagnosis not present

## 2014-08-27 DIAGNOSIS — E1165 Type 2 diabetes mellitus with hyperglycemia: Secondary | ICD-10-CM | POA: Diagnosis not present

## 2014-09-08 DIAGNOSIS — N400 Enlarged prostate without lower urinary tract symptoms: Secondary | ICD-10-CM | POA: Diagnosis not present

## 2014-09-08 DIAGNOSIS — I10 Essential (primary) hypertension: Secondary | ICD-10-CM | POA: Diagnosis not present

## 2014-09-08 DIAGNOSIS — E119 Type 2 diabetes mellitus without complications: Secondary | ICD-10-CM | POA: Diagnosis not present

## 2014-09-08 DIAGNOSIS — E782 Mixed hyperlipidemia: Secondary | ICD-10-CM | POA: Diagnosis not present

## 2014-10-24 DIAGNOSIS — X32XXXD Exposure to sunlight, subsequent encounter: Secondary | ICD-10-CM | POA: Diagnosis not present

## 2014-10-24 DIAGNOSIS — L570 Actinic keratosis: Secondary | ICD-10-CM | POA: Diagnosis not present

## 2014-10-24 DIAGNOSIS — L820 Inflamed seborrheic keratosis: Secondary | ICD-10-CM | POA: Diagnosis not present

## 2015-01-06 DIAGNOSIS — Z23 Encounter for immunization: Secondary | ICD-10-CM | POA: Diagnosis not present

## 2015-02-11 DIAGNOSIS — E119 Type 2 diabetes mellitus without complications: Secondary | ICD-10-CM | POA: Diagnosis not present

## 2015-03-19 DIAGNOSIS — I10 Essential (primary) hypertension: Secondary | ICD-10-CM | POA: Diagnosis not present

## 2015-03-19 DIAGNOSIS — E119 Type 2 diabetes mellitus without complications: Secondary | ICD-10-CM | POA: Diagnosis not present

## 2015-03-19 DIAGNOSIS — E782 Mixed hyperlipidemia: Secondary | ICD-10-CM | POA: Diagnosis not present

## 2015-03-26 DIAGNOSIS — N400 Enlarged prostate without lower urinary tract symptoms: Secondary | ICD-10-CM | POA: Diagnosis not present

## 2015-03-26 DIAGNOSIS — E119 Type 2 diabetes mellitus without complications: Secondary | ICD-10-CM | POA: Diagnosis not present

## 2015-03-26 DIAGNOSIS — E782 Mixed hyperlipidemia: Secondary | ICD-10-CM | POA: Diagnosis not present

## 2015-03-26 DIAGNOSIS — I10 Essential (primary) hypertension: Secondary | ICD-10-CM | POA: Diagnosis not present

## 2015-08-13 DIAGNOSIS — L258 Unspecified contact dermatitis due to other agents: Secondary | ICD-10-CM | POA: Diagnosis not present

## 2015-08-19 DIAGNOSIS — N5201 Erectile dysfunction due to arterial insufficiency: Secondary | ICD-10-CM | POA: Diagnosis not present

## 2015-08-19 DIAGNOSIS — N401 Enlarged prostate with lower urinary tract symptoms: Secondary | ICD-10-CM | POA: Diagnosis not present

## 2015-08-19 DIAGNOSIS — N200 Calculus of kidney: Secondary | ICD-10-CM | POA: Diagnosis not present

## 2015-08-19 DIAGNOSIS — R351 Nocturia: Secondary | ICD-10-CM | POA: Diagnosis not present

## 2015-09-01 DIAGNOSIS — Z0000 Encounter for general adult medical examination without abnormal findings: Secondary | ICD-10-CM | POA: Diagnosis not present

## 2015-09-01 DIAGNOSIS — I10 Essential (primary) hypertension: Secondary | ICD-10-CM | POA: Diagnosis not present

## 2015-09-01 DIAGNOSIS — E1165 Type 2 diabetes mellitus with hyperglycemia: Secondary | ICD-10-CM | POA: Diagnosis not present

## 2015-09-01 DIAGNOSIS — E119 Type 2 diabetes mellitus without complications: Secondary | ICD-10-CM | POA: Diagnosis not present

## 2015-09-01 DIAGNOSIS — E782 Mixed hyperlipidemia: Secondary | ICD-10-CM | POA: Diagnosis not present

## 2015-09-14 DIAGNOSIS — E119 Type 2 diabetes mellitus without complications: Secondary | ICD-10-CM | POA: Diagnosis not present

## 2015-09-14 DIAGNOSIS — E782 Mixed hyperlipidemia: Secondary | ICD-10-CM | POA: Diagnosis not present

## 2015-09-14 DIAGNOSIS — Z0000 Encounter for general adult medical examination without abnormal findings: Secondary | ICD-10-CM | POA: Diagnosis not present

## 2015-09-14 DIAGNOSIS — I10 Essential (primary) hypertension: Secondary | ICD-10-CM | POA: Diagnosis not present

## 2015-09-14 DIAGNOSIS — N400 Enlarged prostate without lower urinary tract symptoms: Secondary | ICD-10-CM | POA: Diagnosis not present

## 2015-11-05 DIAGNOSIS — Z23 Encounter for immunization: Secondary | ICD-10-CM | POA: Diagnosis not present

## 2016-03-07 DIAGNOSIS — N400 Enlarged prostate without lower urinary tract symptoms: Secondary | ICD-10-CM | POA: Diagnosis not present

## 2016-03-07 DIAGNOSIS — E119 Type 2 diabetes mellitus without complications: Secondary | ICD-10-CM | POA: Diagnosis not present

## 2016-03-07 DIAGNOSIS — E782 Mixed hyperlipidemia: Secondary | ICD-10-CM | POA: Diagnosis not present

## 2016-03-07 DIAGNOSIS — I10 Essential (primary) hypertension: Secondary | ICD-10-CM | POA: Diagnosis not present

## 2016-03-07 DIAGNOSIS — Z125 Encounter for screening for malignant neoplasm of prostate: Secondary | ICD-10-CM | POA: Diagnosis not present

## 2016-03-21 DIAGNOSIS — I10 Essential (primary) hypertension: Secondary | ICD-10-CM | POA: Diagnosis not present

## 2016-03-21 DIAGNOSIS — E119 Type 2 diabetes mellitus without complications: Secondary | ICD-10-CM | POA: Diagnosis not present

## 2016-03-21 DIAGNOSIS — E782 Mixed hyperlipidemia: Secondary | ICD-10-CM | POA: Diagnosis not present

## 2016-03-24 DIAGNOSIS — E119 Type 2 diabetes mellitus without complications: Secondary | ICD-10-CM | POA: Diagnosis not present

## 2016-06-23 DIAGNOSIS — H40013 Open angle with borderline findings, low risk, bilateral: Secondary | ICD-10-CM | POA: Diagnosis not present

## 2016-08-17 DIAGNOSIS — Z125 Encounter for screening for malignant neoplasm of prostate: Secondary | ICD-10-CM | POA: Diagnosis not present

## 2016-08-24 DIAGNOSIS — R351 Nocturia: Secondary | ICD-10-CM | POA: Diagnosis not present

## 2016-08-24 DIAGNOSIS — N200 Calculus of kidney: Secondary | ICD-10-CM | POA: Diagnosis not present

## 2016-08-24 DIAGNOSIS — N401 Enlarged prostate with lower urinary tract symptoms: Secondary | ICD-10-CM | POA: Diagnosis not present

## 2016-08-29 DIAGNOSIS — L820 Inflamed seborrheic keratosis: Secondary | ICD-10-CM | POA: Diagnosis not present

## 2016-08-29 DIAGNOSIS — X32XXXD Exposure to sunlight, subsequent encounter: Secondary | ICD-10-CM | POA: Diagnosis not present

## 2016-08-29 DIAGNOSIS — L570 Actinic keratosis: Secondary | ICD-10-CM | POA: Diagnosis not present

## 2016-09-20 DIAGNOSIS — M9904 Segmental and somatic dysfunction of sacral region: Secondary | ICD-10-CM | POA: Diagnosis not present

## 2016-09-20 DIAGNOSIS — M5137 Other intervertebral disc degeneration, lumbosacral region: Secondary | ICD-10-CM | POA: Diagnosis not present

## 2016-09-20 DIAGNOSIS — M4036 Flatback syndrome, lumbar region: Secondary | ICD-10-CM | POA: Diagnosis not present

## 2016-09-20 DIAGNOSIS — Q72811 Congenital shortening of right lower limb: Secondary | ICD-10-CM | POA: Diagnosis not present

## 2016-09-20 DIAGNOSIS — M9903 Segmental and somatic dysfunction of lumbar region: Secondary | ICD-10-CM | POA: Diagnosis not present

## 2016-09-20 DIAGNOSIS — M9905 Segmental and somatic dysfunction of pelvic region: Secondary | ICD-10-CM | POA: Diagnosis not present

## 2016-09-27 DIAGNOSIS — E119 Type 2 diabetes mellitus without complications: Secondary | ICD-10-CM | POA: Diagnosis not present

## 2016-09-27 DIAGNOSIS — Z0000 Encounter for general adult medical examination without abnormal findings: Secondary | ICD-10-CM | POA: Diagnosis not present

## 2016-09-27 DIAGNOSIS — I10 Essential (primary) hypertension: Secondary | ICD-10-CM | POA: Diagnosis not present

## 2016-09-27 DIAGNOSIS — Z23 Encounter for immunization: Secondary | ICD-10-CM | POA: Diagnosis not present

## 2016-09-27 DIAGNOSIS — E782 Mixed hyperlipidemia: Secondary | ICD-10-CM | POA: Diagnosis not present

## 2016-09-27 DIAGNOSIS — E1165 Type 2 diabetes mellitus with hyperglycemia: Secondary | ICD-10-CM | POA: Diagnosis not present

## 2016-10-04 DIAGNOSIS — E119 Type 2 diabetes mellitus without complications: Secondary | ICD-10-CM | POA: Diagnosis not present

## 2016-10-04 DIAGNOSIS — R809 Proteinuria, unspecified: Secondary | ICD-10-CM | POA: Diagnosis not present

## 2016-10-04 DIAGNOSIS — R1033 Periumbilical pain: Secondary | ICD-10-CM | POA: Diagnosis not present

## 2016-10-04 DIAGNOSIS — E782 Mixed hyperlipidemia: Secondary | ICD-10-CM | POA: Diagnosis not present

## 2016-10-04 DIAGNOSIS — E1121 Type 2 diabetes mellitus with diabetic nephropathy: Secondary | ICD-10-CM | POA: Diagnosis not present

## 2016-11-16 DIAGNOSIS — Z23 Encounter for immunization: Secondary | ICD-10-CM | POA: Diagnosis not present

## 2016-12-01 DIAGNOSIS — H40013 Open angle with borderline findings, low risk, bilateral: Secondary | ICD-10-CM | POA: Diagnosis not present

## 2016-12-01 DIAGNOSIS — E119 Type 2 diabetes mellitus without complications: Secondary | ICD-10-CM | POA: Diagnosis not present

## 2017-03-27 DIAGNOSIS — X32XXXD Exposure to sunlight, subsequent encounter: Secondary | ICD-10-CM | POA: Diagnosis not present

## 2017-03-27 DIAGNOSIS — L570 Actinic keratosis: Secondary | ICD-10-CM | POA: Diagnosis not present

## 2017-04-18 DIAGNOSIS — E1165 Type 2 diabetes mellitus with hyperglycemia: Secondary | ICD-10-CM | POA: Diagnosis not present

## 2017-04-18 DIAGNOSIS — E782 Mixed hyperlipidemia: Secondary | ICD-10-CM | POA: Diagnosis not present

## 2017-04-18 DIAGNOSIS — E119 Type 2 diabetes mellitus without complications: Secondary | ICD-10-CM | POA: Diagnosis not present

## 2017-05-03 DIAGNOSIS — I10 Essential (primary) hypertension: Secondary | ICD-10-CM | POA: Diagnosis not present

## 2017-05-03 DIAGNOSIS — E1121 Type 2 diabetes mellitus with diabetic nephropathy: Secondary | ICD-10-CM | POA: Diagnosis not present

## 2017-05-03 DIAGNOSIS — E119 Type 2 diabetes mellitus without complications: Secondary | ICD-10-CM | POA: Diagnosis not present

## 2017-05-03 DIAGNOSIS — E782 Mixed hyperlipidemia: Secondary | ICD-10-CM | POA: Diagnosis not present

## 2017-06-05 DIAGNOSIS — E119 Type 2 diabetes mellitus without complications: Secondary | ICD-10-CM | POA: Diagnosis not present

## 2017-08-11 DIAGNOSIS — Z23 Encounter for immunization: Secondary | ICD-10-CM | POA: Diagnosis not present

## 2017-08-15 DIAGNOSIS — M7541 Impingement syndrome of right shoulder: Secondary | ICD-10-CM | POA: Diagnosis not present

## 2017-08-15 DIAGNOSIS — M25511 Pain in right shoulder: Secondary | ICD-10-CM | POA: Diagnosis not present

## 2017-08-16 DIAGNOSIS — X32XXXD Exposure to sunlight, subsequent encounter: Secondary | ICD-10-CM | POA: Diagnosis not present

## 2017-08-16 DIAGNOSIS — L570 Actinic keratosis: Secondary | ICD-10-CM | POA: Diagnosis not present

## 2017-08-16 DIAGNOSIS — B078 Other viral warts: Secondary | ICD-10-CM | POA: Diagnosis not present

## 2017-08-16 DIAGNOSIS — L820 Inflamed seborrheic keratosis: Secondary | ICD-10-CM | POA: Diagnosis not present

## 2017-08-25 DIAGNOSIS — N200 Calculus of kidney: Secondary | ICD-10-CM | POA: Diagnosis not present

## 2017-08-25 DIAGNOSIS — N401 Enlarged prostate with lower urinary tract symptoms: Secondary | ICD-10-CM | POA: Diagnosis not present

## 2017-08-25 DIAGNOSIS — R351 Nocturia: Secondary | ICD-10-CM | POA: Diagnosis not present

## 2017-09-08 DIAGNOSIS — Z23 Encounter for immunization: Secondary | ICD-10-CM | POA: Diagnosis not present

## 2017-09-20 DIAGNOSIS — M25511 Pain in right shoulder: Secondary | ICD-10-CM | POA: Diagnosis not present

## 2017-09-20 DIAGNOSIS — M7541 Impingement syndrome of right shoulder: Secondary | ICD-10-CM | POA: Diagnosis not present

## 2017-10-18 DIAGNOSIS — E1121 Type 2 diabetes mellitus with diabetic nephropathy: Secondary | ICD-10-CM | POA: Diagnosis not present

## 2017-10-18 DIAGNOSIS — E782 Mixed hyperlipidemia: Secondary | ICD-10-CM | POA: Diagnosis not present

## 2017-10-18 DIAGNOSIS — E1165 Type 2 diabetes mellitus with hyperglycemia: Secondary | ICD-10-CM | POA: Diagnosis not present

## 2017-10-18 DIAGNOSIS — I10 Essential (primary) hypertension: Secondary | ICD-10-CM | POA: Diagnosis not present

## 2017-10-18 DIAGNOSIS — Z0000 Encounter for general adult medical examination without abnormal findings: Secondary | ICD-10-CM | POA: Diagnosis not present

## 2017-10-18 DIAGNOSIS — E119 Type 2 diabetes mellitus without complications: Secondary | ICD-10-CM | POA: Diagnosis not present

## 2017-10-25 DIAGNOSIS — R809 Proteinuria, unspecified: Secondary | ICD-10-CM | POA: Diagnosis not present

## 2017-10-25 DIAGNOSIS — N400 Enlarged prostate without lower urinary tract symptoms: Secondary | ICD-10-CM | POA: Diagnosis not present

## 2017-10-25 DIAGNOSIS — E1169 Type 2 diabetes mellitus with other specified complication: Secondary | ICD-10-CM | POA: Diagnosis not present

## 2017-10-25 DIAGNOSIS — E782 Mixed hyperlipidemia: Secondary | ICD-10-CM | POA: Diagnosis not present

## 2017-10-25 DIAGNOSIS — E1121 Type 2 diabetes mellitus with diabetic nephropathy: Secondary | ICD-10-CM | POA: Diagnosis not present

## 2017-10-25 DIAGNOSIS — Z23 Encounter for immunization: Secondary | ICD-10-CM | POA: Diagnosis not present

## 2017-10-25 DIAGNOSIS — F411 Generalized anxiety disorder: Secondary | ICD-10-CM | POA: Diagnosis not present

## 2017-10-25 DIAGNOSIS — H9193 Unspecified hearing loss, bilateral: Secondary | ICD-10-CM | POA: Diagnosis not present

## 2017-10-25 DIAGNOSIS — E1129 Type 2 diabetes mellitus with other diabetic kidney complication: Secondary | ICD-10-CM | POA: Diagnosis not present

## 2017-10-25 DIAGNOSIS — I10 Essential (primary) hypertension: Secondary | ICD-10-CM | POA: Diagnosis not present

## 2017-11-14 DIAGNOSIS — H9190 Unspecified hearing loss, unspecified ear: Secondary | ICD-10-CM | POA: Diagnosis not present

## 2017-11-14 DIAGNOSIS — H903 Sensorineural hearing loss, bilateral: Secondary | ICD-10-CM | POA: Diagnosis not present

## 2017-12-04 DIAGNOSIS — E119 Type 2 diabetes mellitus without complications: Secondary | ICD-10-CM | POA: Diagnosis not present

## 2017-12-04 DIAGNOSIS — H40013 Open angle with borderline findings, low risk, bilateral: Secondary | ICD-10-CM | POA: Diagnosis not present

## 2018-02-14 DIAGNOSIS — Z23 Encounter for immunization: Secondary | ICD-10-CM | POA: Diagnosis not present

## 2018-03-19 DIAGNOSIS — M7541 Impingement syndrome of right shoulder: Secondary | ICD-10-CM | POA: Diagnosis not present

## 2018-03-19 DIAGNOSIS — M25511 Pain in right shoulder: Secondary | ICD-10-CM | POA: Diagnosis not present

## 2018-03-24 DIAGNOSIS — M25511 Pain in right shoulder: Secondary | ICD-10-CM | POA: Diagnosis not present

## 2018-04-02 DIAGNOSIS — M25511 Pain in right shoulder: Secondary | ICD-10-CM | POA: Diagnosis not present

## 2018-04-17 DIAGNOSIS — M7541 Impingement syndrome of right shoulder: Secondary | ICD-10-CM | POA: Diagnosis not present

## 2018-04-17 DIAGNOSIS — S43431A Superior glenoid labrum lesion of right shoulder, initial encounter: Secondary | ICD-10-CM | POA: Diagnosis not present

## 2018-04-17 DIAGNOSIS — M19011 Primary osteoarthritis, right shoulder: Secondary | ICD-10-CM | POA: Diagnosis not present

## 2018-04-17 DIAGNOSIS — G8918 Other acute postprocedural pain: Secondary | ICD-10-CM | POA: Diagnosis not present

## 2018-04-17 DIAGNOSIS — S43491D Other sprain of right shoulder joint, subsequent encounter: Secondary | ICD-10-CM | POA: Diagnosis not present

## 2018-04-17 DIAGNOSIS — M75111 Incomplete rotator cuff tear or rupture of right shoulder, not specified as traumatic: Secondary | ICD-10-CM | POA: Diagnosis not present

## 2018-04-25 DIAGNOSIS — M25611 Stiffness of right shoulder, not elsewhere classified: Secondary | ICD-10-CM | POA: Diagnosis not present

## 2018-05-02 DIAGNOSIS — E782 Mixed hyperlipidemia: Secondary | ICD-10-CM | POA: Diagnosis not present

## 2018-05-02 DIAGNOSIS — E1169 Type 2 diabetes mellitus with other specified complication: Secondary | ICD-10-CM | POA: Diagnosis not present

## 2018-05-02 DIAGNOSIS — I10 Essential (primary) hypertension: Secondary | ICD-10-CM | POA: Diagnosis not present

## 2018-05-02 DIAGNOSIS — E1121 Type 2 diabetes mellitus with diabetic nephropathy: Secondary | ICD-10-CM | POA: Diagnosis not present

## 2018-05-02 DIAGNOSIS — M25611 Stiffness of right shoulder, not elsewhere classified: Secondary | ICD-10-CM | POA: Diagnosis not present

## 2018-05-09 DIAGNOSIS — M25611 Stiffness of right shoulder, not elsewhere classified: Secondary | ICD-10-CM | POA: Diagnosis not present

## 2018-05-16 DIAGNOSIS — E782 Mixed hyperlipidemia: Secondary | ICD-10-CM | POA: Diagnosis not present

## 2018-05-16 DIAGNOSIS — E1169 Type 2 diabetes mellitus with other specified complication: Secondary | ICD-10-CM | POA: Diagnosis not present

## 2018-05-16 DIAGNOSIS — M25611 Stiffness of right shoulder, not elsewhere classified: Secondary | ICD-10-CM | POA: Diagnosis not present

## 2018-05-16 DIAGNOSIS — I10 Essential (primary) hypertension: Secondary | ICD-10-CM | POA: Diagnosis not present

## 2018-05-21 DIAGNOSIS — L570 Actinic keratosis: Secondary | ICD-10-CM | POA: Diagnosis not present

## 2018-05-21 DIAGNOSIS — X32XXXD Exposure to sunlight, subsequent encounter: Secondary | ICD-10-CM | POA: Diagnosis not present

## 2018-05-22 DIAGNOSIS — M25611 Stiffness of right shoulder, not elsewhere classified: Secondary | ICD-10-CM | POA: Diagnosis not present

## 2018-05-29 DIAGNOSIS — M25611 Stiffness of right shoulder, not elsewhere classified: Secondary | ICD-10-CM | POA: Diagnosis not present

## 2018-05-31 DIAGNOSIS — M25611 Stiffness of right shoulder, not elsewhere classified: Secondary | ICD-10-CM | POA: Diagnosis not present

## 2018-06-05 DIAGNOSIS — M25611 Stiffness of right shoulder, not elsewhere classified: Secondary | ICD-10-CM | POA: Diagnosis not present

## 2018-06-08 DIAGNOSIS — M25611 Stiffness of right shoulder, not elsewhere classified: Secondary | ICD-10-CM | POA: Diagnosis not present

## 2018-06-12 DIAGNOSIS — M25611 Stiffness of right shoulder, not elsewhere classified: Secondary | ICD-10-CM | POA: Diagnosis not present

## 2018-06-15 DIAGNOSIS — M25611 Stiffness of right shoulder, not elsewhere classified: Secondary | ICD-10-CM | POA: Diagnosis not present

## 2018-06-19 DIAGNOSIS — M25611 Stiffness of right shoulder, not elsewhere classified: Secondary | ICD-10-CM | POA: Diagnosis not present

## 2018-06-21 DIAGNOSIS — M25611 Stiffness of right shoulder, not elsewhere classified: Secondary | ICD-10-CM | POA: Diagnosis not present

## 2018-07-03 DIAGNOSIS — M25511 Pain in right shoulder: Secondary | ICD-10-CM | POA: Diagnosis not present

## 2018-07-05 DIAGNOSIS — M25611 Stiffness of right shoulder, not elsewhere classified: Secondary | ICD-10-CM | POA: Diagnosis not present

## 2018-07-09 DIAGNOSIS — H40013 Open angle with borderline findings, low risk, bilateral: Secondary | ICD-10-CM | POA: Diagnosis not present

## 2018-07-09 DIAGNOSIS — E119 Type 2 diabetes mellitus without complications: Secondary | ICD-10-CM | POA: Diagnosis not present

## 2018-07-10 DIAGNOSIS — T783XXA Angioneurotic edema, initial encounter: Secondary | ICD-10-CM | POA: Diagnosis not present

## 2018-07-10 DIAGNOSIS — I10 Essential (primary) hypertension: Secondary | ICD-10-CM | POA: Diagnosis not present

## 2018-07-10 DIAGNOSIS — Z7189 Other specified counseling: Secondary | ICD-10-CM | POA: Diagnosis not present

## 2018-07-13 DIAGNOSIS — M25611 Stiffness of right shoulder, not elsewhere classified: Secondary | ICD-10-CM | POA: Diagnosis not present

## 2018-07-16 DIAGNOSIS — H10011 Acute follicular conjunctivitis, right eye: Secondary | ICD-10-CM | POA: Diagnosis not present

## 2018-07-18 DIAGNOSIS — T783XXA Angioneurotic edema, initial encounter: Secondary | ICD-10-CM | POA: Diagnosis not present

## 2018-07-18 DIAGNOSIS — Z7189 Other specified counseling: Secondary | ICD-10-CM | POA: Diagnosis not present

## 2018-07-18 DIAGNOSIS — I10 Essential (primary) hypertension: Secondary | ICD-10-CM | POA: Diagnosis not present

## 2018-07-18 DIAGNOSIS — M25611 Stiffness of right shoulder, not elsewhere classified: Secondary | ICD-10-CM | POA: Diagnosis not present

## 2018-07-25 DIAGNOSIS — T7849XA Other allergy, initial encounter: Secondary | ICD-10-CM | POA: Diagnosis not present

## 2018-07-25 DIAGNOSIS — D841 Defects in the complement system: Secondary | ICD-10-CM | POA: Diagnosis not present

## 2018-07-25 DIAGNOSIS — L508 Other urticaria: Secondary | ICD-10-CM | POA: Diagnosis not present

## 2018-07-25 DIAGNOSIS — M25611 Stiffness of right shoulder, not elsewhere classified: Secondary | ICD-10-CM | POA: Diagnosis not present

## 2018-07-25 DIAGNOSIS — L500 Allergic urticaria: Secondary | ICD-10-CM | POA: Diagnosis not present

## 2018-07-30 DIAGNOSIS — D841 Defects in the complement system: Secondary | ICD-10-CM | POA: Diagnosis not present

## 2018-07-30 DIAGNOSIS — L500 Allergic urticaria: Secondary | ICD-10-CM | POA: Diagnosis not present

## 2018-08-15 DIAGNOSIS — M542 Cervicalgia: Secondary | ICD-10-CM | POA: Diagnosis not present

## 2018-08-15 DIAGNOSIS — I10 Essential (primary) hypertension: Secondary | ICD-10-CM | POA: Diagnosis not present

## 2018-08-15 DIAGNOSIS — Z7189 Other specified counseling: Secondary | ICD-10-CM | POA: Diagnosis not present

## 2018-08-22 DIAGNOSIS — I10 Essential (primary) hypertension: Secondary | ICD-10-CM | POA: Diagnosis not present

## 2018-08-22 DIAGNOSIS — E1129 Type 2 diabetes mellitus with other diabetic kidney complication: Secondary | ICD-10-CM | POA: Diagnosis not present

## 2018-08-22 DIAGNOSIS — E1169 Type 2 diabetes mellitus with other specified complication: Secondary | ICD-10-CM | POA: Diagnosis not present

## 2018-08-22 DIAGNOSIS — Z7189 Other specified counseling: Secondary | ICD-10-CM | POA: Diagnosis not present

## 2018-08-22 DIAGNOSIS — E782 Mixed hyperlipidemia: Secondary | ICD-10-CM | POA: Diagnosis not present

## 2018-09-12 DIAGNOSIS — R351 Nocturia: Secondary | ICD-10-CM | POA: Diagnosis not present

## 2018-09-12 DIAGNOSIS — N401 Enlarged prostate with lower urinary tract symptoms: Secondary | ICD-10-CM | POA: Diagnosis not present

## 2018-09-17 DIAGNOSIS — Z7189 Other specified counseling: Secondary | ICD-10-CM | POA: Diagnosis not present

## 2018-09-17 DIAGNOSIS — I10 Essential (primary) hypertension: Secondary | ICD-10-CM | POA: Diagnosis not present

## 2018-09-17 DIAGNOSIS — E1169 Type 2 diabetes mellitus with other specified complication: Secondary | ICD-10-CM | POA: Diagnosis not present

## 2018-09-17 DIAGNOSIS — E782 Mixed hyperlipidemia: Secondary | ICD-10-CM | POA: Diagnosis not present

## 2018-10-01 DIAGNOSIS — Z23 Encounter for immunization: Secondary | ICD-10-CM | POA: Diagnosis not present

## 2018-12-03 DIAGNOSIS — C44329 Squamous cell carcinoma of skin of other parts of face: Secondary | ICD-10-CM | POA: Diagnosis not present

## 2018-12-06 ENCOUNTER — Emergency Department (HOSPITAL_COMMUNITY): Payer: Medicare Other

## 2018-12-06 ENCOUNTER — Emergency Department (HOSPITAL_COMMUNITY)
Admission: EM | Admit: 2018-12-06 | Discharge: 2018-12-06 | Disposition: A | Discharge status: Home / Self Care | Payer: Medicare Other | Attending: Emergency Medicine | Admitting: Emergency Medicine

## 2018-12-06 ENCOUNTER — Other Ambulatory Visit: Payer: Self-pay

## 2018-12-06 ENCOUNTER — Encounter (HOSPITAL_COMMUNITY): Payer: Self-pay | Admitting: Emergency Medicine

## 2018-12-06 DIAGNOSIS — Y939 Activity, unspecified: Secondary | ICD-10-CM | POA: Diagnosis not present

## 2018-12-06 DIAGNOSIS — Y929 Unspecified place or not applicable: Secondary | ICD-10-CM | POA: Insufficient documentation

## 2018-12-06 DIAGNOSIS — Y999 Unspecified external cause status: Secondary | ICD-10-CM | POA: Insufficient documentation

## 2018-12-06 DIAGNOSIS — Z79899 Other long term (current) drug therapy: Secondary | ICD-10-CM | POA: Diagnosis not present

## 2018-12-06 DIAGNOSIS — I10 Essential (primary) hypertension: Secondary | ICD-10-CM | POA: Insufficient documentation

## 2018-12-06 DIAGNOSIS — Z7984 Long term (current) use of oral hypoglycemic drugs: Secondary | ICD-10-CM | POA: Insufficient documentation

## 2018-12-06 DIAGNOSIS — E119 Type 2 diabetes mellitus without complications: Secondary | ICD-10-CM | POA: Insufficient documentation

## 2018-12-06 DIAGNOSIS — Z23 Encounter for immunization: Secondary | ICD-10-CM | POA: Diagnosis not present

## 2018-12-06 DIAGNOSIS — W208XXA Other cause of strike by thrown, projected or falling object, initial encounter: Secondary | ICD-10-CM | POA: Insufficient documentation

## 2018-12-06 DIAGNOSIS — S61211A Laceration without foreign body of left index finger without damage to nail, initial encounter: Secondary | ICD-10-CM | POA: Diagnosis not present

## 2018-12-06 DIAGNOSIS — S6992XA Unspecified injury of left wrist, hand and finger(s), initial encounter: Secondary | ICD-10-CM | POA: Diagnosis not present

## 2018-12-06 MED ORDER — BACITRACIN ZINC 500 UNIT/GM EX OINT: TOPICAL_OINTMENT | Freq: Once | CUTANEOUS | Status: DC

## 2018-12-06 MED ORDER — LIDOCAINE HCL (PF) 1 % IJ SOLN
10.0000 mL | Freq: Once | INTRAMUSCULAR | Status: DC
  Filled 2018-12-06: qty 10

## 2018-12-06 MED ORDER — TETANUS-DIPHTH-ACELL PERTUSSIS 5-2.5-18.5 LF-MCG/0.5 IM SUSP
0.5000 mL | Freq: Once | INTRAMUSCULAR | Status: AC
  Administered 2018-12-06: 0.5 mL via INTRAMUSCULAR
  Filled 2018-12-06: qty 0.5

## 2018-12-06 NOTE — Discharge Instructions (Addendum)
You were seen in the emergency department today for a laceration. Your laceration was closed with 7 stitches. Please keep this area clean and dry for the next 24 hours, after 24 hours you may get this area wet, but avoid soaking the area. Keep the area covered as best possible especially when in the sun to help in minimizing scarring.   Your tetanus has been updated   Your x-ray did not show a fracture.  You will need to have the stitches removed and the wound rechecked in 7 days. Please return to the emergency department, go to an urgent care, or see your primary care provider to have this performed. Return to the ER soon should you start to experience pus type drainage from the wound, redness around the wound, or fevers as this could indicate the area is infected, please return to the ER for any other worsening symptoms or concerns that you may have.

## 2018-12-06 NOTE — ED Triage Notes (Signed)
Pt has laceration to right pointer finger. Last tetanus over 10 years ago.

## 2018-12-06 NOTE — ED Provider Notes (Signed)
[facility] EMERGENCY DEPARTMENT Provider Note   CSN: [id number] Arrival date & time: 12/06/18  1252     History   Chief Complaint Chief Complaint  Patient presents with  . Finger Injury    HPI Ian [name] is a 78 y.o. male with a hx of DM, HTN, hyperlipidemia, & nephrolithiasis who presents to the emergency department with complaints of left second finger injury which occurred about 30 minutes prior to ED arrival.  Patient states that his left hand was on the gutter rail when a piece of wood fell onto it resulting in a laceration to the palmar aspect of the distal phalanx.  He applied pressure with improvement in bleeding.  He states the area is uncomfortable but not significantly painful.  He denies numbness, tingling, weakness, or other areas of injury.  Unknown last tetanus shot. Patient is R hand dominant.     HPI  Past Medical History:  Diagnosis Date  . Diabetes mellitus without complication ([place])   . Enlarged prostate   . GERD (gastroesophageal reflux disease)    occasional zantac  . Hyperlipidemia   . Hypertension   . Kidney stone   . Sinus problem     There are no active problems to display for this patient.   Past Surgical History:  Procedure Laterality Date  . cataracts removed  2014  . CYSTOSCOPY WITH RETROGRADE PYELOGRAM, URETEROSCOPY AND STENT PLACEMENT Left 02/20/2014   Procedure: CYSTOSCOPY WITH RETROGRADE PYELOGRAM, URETEROSCOPY AND STENT PLACEMENT;  Surgeon: Raynelle Bring, MD;  Location: [facility];  Service: Urology;  Laterality: Left;  . HOLMIUM LASER APPLICATION Left [id number]   Procedure: HOLMIUM LASER APPLICATION;  Surgeon: Raynelle Bring, MD;  Location: [facility];  Service: Urology;  Laterality: Left;  . LITHOTRIPSY     x4 >12 yrs ago        Home Medications    Prior to Admission medications   Medication Sig Start Date End Date Taking? Authorizing Provider  cholecalciferol (VITAMIN D) 1000 UNITS tablet Take 1,000 Units by  mouth daily.    [provider]  finasteride (PROSCAR) 5 MG tablet Take 5 mg by mouth daily.    [provider]  HYDROcodone-acetaminophen (NORCO/VICODIN) 5-325 MG per tablet Take 1-2 tablets by mouth every 6 (six) hours as needed. 02/20/14   Raynelle Bring, MD  lisinopril (PRINIVIL,ZESTRIL) 5 MG tablet Take 5 mg by mouth every morning.    [provider]  loratadine (CLARITIN) 10 MG tablet Take 10 mg by mouth daily as needed for allergies.    [provider]  meclizine (ANTIVERT) 25 MG tablet Take 2 tablets by mouth 3 (three) times daily as needed for dizziness.  04/18/14   [provider]  metFORMIN (GLUCOPHAGE) 500 MG tablet Take 500 mg by mouth 2 (two) times daily with a meal.    [provider]  Naphazoline-Pheniramine 0.027-0.315 % SOLN Apply 1 drop to eye 2 (two) times daily as needed (Irritation).    [provider]  phenazopyridine (PYRIDIUM) 100 MG tablet Take 1 tablet (100 mg total) by mouth 3 (three) times daily as needed for pain (for burning). 02/20/14   Raynelle Bring, MD  rosuvastatin (CRESTOR) 10 MG tablet Take 10 mg by mouth daily.    [provider]  tamsulosin (FLOMAX) 0.4 MG CAPS capsule Take 0.4 mg by mouth daily.    [provider]    Family History No family history on file.  Social History Social History   Tobacco  Use  . Smoking status: Never Smoker  Substance Use Topics  . Alcohol use: No  . Drug use: No     Allergies   Patient has no known allergies.   Review of Systems Review of Systems  Constitutional: Negative for chills and fever.  Respiratory: Negative for shortness of breath.   Cardiovascular: Negative for chest pain.  Gastrointestinal: Negative for abdominal pain.  Skin: Positive for wound.  Neurological: Negative for weakness and numbness.     Physical Exam Updated Vital Signs BP (!) 169/88   Pulse 80   Temp 98.4 F (36.9 C) (Oral)   Resp 16   SpO2 97%    Physical Exam Vitals signs and nursing note reviewed.  Constitutional:      General: He is not in acute distress.    Appearance: Normal appearance. He is not ill-appearing or toxic-appearing.  HENT:     Head: Normocephalic and atraumatic.  Neck:     Musculoskeletal: Normal range of motion and neck supple.     Comments: No midline tenderness.  Cardiovascular:     Rate and Rhythm: Normal rate.     Pulses:          Radial pulses are 2+ on the right side and 2+ on the left side.  Pulmonary:     Effort: No respiratory distress.     Breath sounds: Normal breath sounds.  Musculoskeletal:     Comments: Upper extremities: L 2nd distal phalanx: palmar aspect there is a 3 cm curved laceration that does not extend to the nailbed. Approximately 3-4 mm in depth. No obvious FB. No active bleeding. No obvious deformity, appreciable swelling, edema, erythema, ecchymosis, or warmth. Patient has intact AROM throughout. Patient able to flex/extend L 2nd IP joint against resistance. No significant tenderness to palpation, mild tenderness to distal phalanx to L 2nd digit  Skin:    General: Skin is warm and dry.     Capillary Refill: Capillary refill takes less than 2 seconds.  Neurological:     Mental Status: He is alert.     Comments: Alert. Clear speech. Sensation grossly intact to bilateral upper extremities. 5/5 symmetric grip strength. Ambulatory. Able to perform OK, thumbs up, and cross 2nd/3rd digits bilaterally.   Psychiatric:        Mood and Affect: Mood normal.        Behavior: Behavior normal.     ED Treatments / Results  Labs (all labs ordered are listed, but only abnormal results are displayed) Labs Reviewed - No data to display  EKG None  Radiology Dg Finger Index Left  Result Date: 12/06/2018 CLINICAL DATA:  Trauma to the left index finger today. EXAM: LEFT INDEX FINGER 2+V COMPARISON:  None. FINDINGS: There is no evidence of fracture or dislocation. 2 mm radiopaque density is  identified near the distal aspect of the second proximal phalanx, radiopaque foreign body is not excluded if that is the site of injury. IMPRESSION: No acute fracture or dislocation. 2 mm radiopaque density is identified near the distal aspect of the second proximal phalanx, radiopaque foreign body is not excluded if that is the site of injury. Electronically Signed   By: Abelardo Diesel M.D.   On: 12/06/2018 15:17    Procedures .Marland KitchenLaceration Repair  Date/Time: 12/06/2018 3:09 PM Performed by: Amaryllis Dyke, PA-C Authorized by: Amaryllis Dyke, PA-C   Consent:    Consent obtained:  Verbal   Consent given by:  Patient   Risks discussed:  Infection,  need for additional repair, poor wound healing, poor cosmetic result, pain, retained foreign body, tendon damage, vascular damage and nerve damage   Alternatives discussed:  No treatment Anesthesia (see MAR for exact dosages):    Anesthesia method:  Nerve block   Block needle gauge:  25 G   Block anesthetic:  Lidocaine 1% w/o epi   Block injection procedure:  Anatomic landmarks identified, anatomic landmarks palpated, negative aspiration for blood, introduced needle and incremental injection   Block outcome:  Anesthesia achieved Laceration details:    Location:  Finger   Finger location:  L index finger   Length (cm):  3   Depth (mm):  3 Repair type:    Repair type:  Simple Pre-procedure details:    Preparation:  Patient was prepped and draped in usual sterile fashion and imaging obtained to evaluate for foreign bodies Exploration:    Hemostasis achieved with:  Direct pressure   Wound exploration: wound explored through full range of motion and entire depth of wound probed and visualized     Wound extent: no tendon damage noted   Treatment:    Area cleansed with:  Betadine   Amount of cleaning:  Standard   Irrigation solution:  Sterile water   Irrigation method:  Pressure wash Skin repair:    Repair method:  Sutures    Suture size:  4-0   Suture material:  Nylon   Suture technique:  Simple interrupted   Number of sutures:  7 Approximation:    Approximation:  Close Post-procedure details:    Dressing:  Non-adherent dressing and antibiotic ointment   Patient tolerance of procedure:  Tolerated well, no immediate complications   (including critical care time)  Medications Ordered in ED Medications  lidocaine (PF) (XYLOCAINE) 1 % injection 10 mL (has no administration in time range)  Tdap (BOOSTRIX) injection 0.5 mL (0.5 mLs Intramuscular Given 12/06/18 1342)     Initial Impression / Assessment and Plan / ED Course  I have reviewed the triage vital signs and the nursing notes.  Pertinent labs & imaging results that were available during my care of the patient were reviewed by me and considered in my medical decision making (see chart for details).    Patient presents to the emergency department with laceration to the palmar aspect of the 2nd finger distal phalanx which occurred within 8 hours PTA. Patient nontoxic appearing, resting comfortably. X-ray obtained in area of laceration, no fractures/dislocations or apparent radiopaque foreign bodies- 71mm radiopaque density @ distal aspect of 2nd proximal phalanx not in area of injury therefore not felt to be relevant FB. Wound explored and base of wound visualized in a bloodless field without evidence of foreign body. Able to flex/extend against resistance- not consistent w/ tendon injury.  Laceration repair per procedure note above, tolerated well. Tetanus is up to date updated at today's visit.  Discussed suture home care as well as need for wound recheck and suture removal in 7 days.  I discussed results, treatment plan, need for follow-up, and return precautions with the patient including signs of infection. Provided opportunity for questions, patient confirmed understanding and is in agreement with plan.     Final Clinical Impressions(s) / ED Diagnoses    Final diagnoses:  Laceration of left index finger without foreign body without damage to nail, initial encounter    ED Discharge Orders    None       Leafy Kindle 12/06/18 1526    Carmin Muskrat, MD 12/07/18  1453  

## 2018-12-31 DIAGNOSIS — Z0000 Encounter for general adult medical examination without abnormal findings: Secondary | ICD-10-CM | POA: Diagnosis not present

## 2018-12-31 DIAGNOSIS — I10 Essential (primary) hypertension: Secondary | ICD-10-CM | POA: Diagnosis not present

## 2018-12-31 DIAGNOSIS — R519 Headache, unspecified: Secondary | ICD-10-CM | POA: Diagnosis not present

## 2018-12-31 DIAGNOSIS — H5712 Ocular pain, left eye: Secondary | ICD-10-CM | POA: Diagnosis not present

## 2018-12-31 DIAGNOSIS — J0100 Acute maxillary sinusitis, unspecified: Secondary | ICD-10-CM | POA: Diagnosis not present

## 2018-12-31 DIAGNOSIS — E1169 Type 2 diabetes mellitus with other specified complication: Secondary | ICD-10-CM | POA: Diagnosis not present

## 2018-12-31 DIAGNOSIS — E782 Mixed hyperlipidemia: Secondary | ICD-10-CM | POA: Diagnosis not present

## 2019-01-02 DIAGNOSIS — Z08 Encounter for follow-up examination after completed treatment for malignant neoplasm: Secondary | ICD-10-CM | POA: Diagnosis not present

## 2019-01-02 DIAGNOSIS — Z85828 Personal history of other malignant neoplasm of skin: Secondary | ICD-10-CM | POA: Diagnosis not present

## 2019-02-05 DIAGNOSIS — H9113 Presbycusis, bilateral: Secondary | ICD-10-CM | POA: Diagnosis not present

## 2019-02-05 DIAGNOSIS — R519 Headache, unspecified: Secondary | ICD-10-CM | POA: Diagnosis not present

## 2019-02-07 ENCOUNTER — Other Ambulatory Visit: Payer: Self-pay | Admitting: Neurology

## 2019-02-11 ENCOUNTER — Encounter: Payer: Self-pay | Admitting: Neurology

## 2019-02-11 ENCOUNTER — Other Ambulatory Visit: Payer: Self-pay

## 2019-02-11 ENCOUNTER — Telehealth: Payer: Self-pay

## 2019-02-11 ENCOUNTER — Ambulatory Visit (INDEPENDENT_AMBULATORY_CARE_PROVIDER_SITE_OTHER): Payer: Medicare Other | Admitting: Neurology

## 2019-02-11 VITALS — BP 153/74 | HR 70 | Temp 97.4°F | Ht 71.0 in | Wt 165.0 lb

## 2019-02-11 DIAGNOSIS — R519 Headache, unspecified: Secondary | ICD-10-CM | POA: Diagnosis not present

## 2019-02-11 DIAGNOSIS — G501 Atypical facial pain: Secondary | ICD-10-CM

## 2019-02-11 MED ORDER — GABAPENTIN 100 MG PO CAPS: 100.0000 mg | ORAL_CAPSULE | Freq: Every day | ORAL | 3 refills | Status: DC

## 2019-02-11 NOTE — Patient Instructions (Signed)
I would like to order more testing to investigate your left-sided headache and facial pain.  For treatment of your pain I suggest low dose Start Neurontin (gabapentin) 100 mg strength: Take 1 pill nightly. The most common side effects reported are sedation or sleepiness. Rare side effects include balance problems, confusion.  We can increase the dose over time if needed.  I would like to proceed with more blood work to look for inflammatory and autoimmune markers, we will call you with the test results.  I will also order a brain MRI with and without contrast.  In addition, I will request blood test results from your primary care physician's office.  Please follow-up routinely in 3 months, we will keep you posted as to your blood test results and scan results by phone call.

## 2019-02-11 NOTE — Telephone Encounter (Signed)
Hilda Blades, can you contact Dr. Mathis Fare office and obtain recent kidney function panel? Thanks!

## 2019-02-11 NOTE — Progress Notes (Signed)
Subjective:    Patient ID: Ian [name] is a 79 y.o. male.  HPI     Star Age, MD, PhD [facility] [address] [place], [place] [address]  Dear Dr. Constance Holster,   I saw your patient, Ian [name], upon your kind request to my neurologic clinic today for initial consultation of his left-sided facial pain.  The patient is accompanied by his wife today.  As you know, Ian [name] is a 79 year old right-handed gentleman with an underlying medical history of hypertension, diabetes, hyperlipidemia, and hearing loss, who reports a 2-3 mo history of left-sided facial pain.  The pain is more noticeable when he bends down or when he first lays back to sleep at night.  Seems to be worse at night.  He has been taking ibuprofen up to twice daily or Tylenol or Excedrin up to twice daily, feels like his pain level goes down to about a 3 out of 10 versus without medication up to 8 out of 10.  He denies any shocklike or electric-like jolts of pain, no obvious triggers such as chewing or talking or toothbrushing. Symptoms started in the jaw area which is why he first saw his dentist.  He reports that he had x-rays which did not show any evidence of abscess or any dental problem. He sometimes has a throbbing sensation and has no prior history of migraine headaches, no history of visual disturbance, has an eye examination twice a year, denies any numbness and any other neurological symptoms elsewhere.  He had rotator cuff surgery on the right successfully some 9 or 10 months ago, has finished physical therapy and did well overall.  He is hard of hearing, has bilateral hearing aids.  He had cataract repairs bilaterally.  He denies any widespread joint pain, no tenderness in the scalp.  Denies any symptoms of peripheral neuropathy, last A1c per his report was 6.8, he had blood work with his PCP about a month ago. He has a history of vertigo.  Balance is not always as good.  He  denies smoking, drinks alcohol rarely, caffeine in the form of coffee, 2 to 3 cups in the mornings and otherwise no caffeine the rest of the day.  He does not always hydrate well, admits to drinking maybe 3 cups of water per day, wife estimates up to 4/day. He had had a CT of the paranasal sinuses without contrast on 02/05/2019 and I reviewed the results: Impression: Mucosal edema in the frontal, maxillary and ethmoid sinuses bilaterally without air-fluid level.  Narrowing of the ostiomeatal complex bilaterally due to [facility] cells and mucosal edema. I reviewed your office note from 11/14/2017, as well as 02/05/2019.  His Past Medical History Is Significant For: Past Medical History:  Diagnosis Date  . Diabetes mellitus without complication ([place])   . Enlarged prostate   . GERD (gastroesophageal reflux disease)    occasional zantac  . Hyperlipidemia   . Hypertension   . Kidney stone   . Sinus problem     His Past Surgical History Is Significant For: Past Surgical History:  Procedure Laterality Date  . cataracts removed  2014  . CYSTOSCOPY WITH RETROGRADE PYELOGRAM, URETEROSCOPY AND STENT PLACEMENT Left 02/20/2014   Procedure: CYSTOSCOPY WITH RETROGRADE PYELOGRAM, URETEROSCOPY AND STENT PLACEMENT;  Surgeon: Raynelle Bring, MD;  Location: [facility];  Service: Urology;  Laterality: Left;  . HOLMIUM LASER APPLICATION Left [id number]   Procedure: HOLMIUM LASER APPLICATION;  Surgeon: Raynelle Bring, MD;  Location: [facility];  Service: Urology;  Laterality: Left;  . LITHOTRIPSY     x4 >12 yrs ago    His Family History Is Significant For: History reviewed. No pertinent family history.  His Social History Is Significant For: Social History   Socioeconomic History  . Marital status: Married    Spouse name: Not on file  . Number of children: Not on file  . Years of education: Not on file  . Highest education level: Not on file  Occupational History  . Not on file  Tobacco Use  . Smoking status:  Never Smoker  . Smokeless tobacco: Never Used  Substance and Sexual Activity  . Alcohol use: Yes    Comment: Occational glass of wine   . Drug use: No  . Sexual activity: Not on file  Other Topics Concern  . Not on file  Social History Narrative  . Not on file   Social Determinants of Health   Financial Resource Strain:   . Difficulty of Paying Living Expenses: Not on file  Food Insecurity:   . Worried About Charity fundraiser in the Last Year: Not on file  . Ran Out of Food in the Last Year: Not on file  Transportation Needs:   . Lack of Transportation (Medical): Not on file  . Lack of Transportation (Non-Medical): Not on file  Physical Activity:   . Days of Exercise per Week: Not on file  . Minutes of Exercise per Session: Not on file  Stress:   . Feeling of Stress : Not on file  Social Connections:   . Frequency of Communication with Friends and Family: Not on file  . Frequency of Social Gatherings with Friends and Family: Not on file  . Attends Religious Services: Not on file  . Active Member of Clubs or Organizations: Not on file  . Attends Archivist Meetings: Not on file  . Marital Status: Not on file    His Allergies Are:  No Known Allergies:   His Current Medications Are:  Outpatient Encounter Medications as of 02/11/2019  Medication Sig  . amLODipine (NORVASC) 5 MG tablet 2.5 mg daily.  . carvedilol (COREG) 6.25 MG tablet Take 6.25 mg by mouth 2 (two) times daily.  . cholecalciferol (VITAMIN D) 1000 UNITS tablet Take 1,000 Units by mouth daily.  . Cinnamon 500 MG capsule Cinnamon  . Coenzyme Q10 (COQ-10) 100 MG CAPS CoQ-10  . finasteride (PROSCAR) 5 MG tablet Take 5 mg by mouth daily.  Marland Kitchen HYDROcodone-acetaminophen (NORCO/VICODIN) 5-325 MG per tablet Take 1-2 tablets by mouth every 6 (six) hours as needed.  . loratadine (CLARITIN) 10 MG tablet Take 10 mg by mouth daily as needed for allergies.  Marland Kitchen meclizine (ANTIVERT) 25 MG tablet Take 2 tablets by  mouth 3 (three) times daily as needed for dizziness.   . metFORMIN (GLUCOPHAGE) 500 MG tablet Take 500 mg by mouth 2 (two) times daily with a meal.  . Omega-3 Fatty Acids (RA FISH OIL) 1000 MG CAPS Fish Oil  . rosuvastatin (CRESTOR) 10 MG tablet Take 10 mg by mouth daily.  . tamsulosin (FLOMAX) 0.4 MG CAPS capsule Take 0.4 mg by mouth daily.  Marland Kitchen gabapentin (NEURONTIN) 100 MG capsule Take 1 capsule (100 mg total) by mouth at bedtime.  . [DISCONTINUED] lisinopril (PRINIVIL,ZESTRIL) 5 MG tablet Take 5 mg by mouth every morning.  . [DISCONTINUED] Naphazoline-Pheniramine 0.027-0.315 % SOLN Apply 1 drop to eye 2 (two) times daily as needed (Irritation).  . [  DISCONTINUED] phenazopyridine (PYRIDIUM) 100 MG tablet Take 1 tablet (100 mg total) by mouth 3 (three) times daily as needed for pain (for burning).   No facility-administered encounter medications on file as of 02/11/2019.  :  Review of Systems:  Out of a complete 14 point review of systems, all are reviewed and negative with the exception of these symptoms as listed below: Review of Systems  Neurological:       Reports headache on the left side of his face. Reports pain radiates from temple to lower jaw. Pt sts pain is worse when he bends over and when laying down at night. Reports pain is 4-5 on the pail scale when taking otc treatments ibuprofen or Excedrin.   ENT has ruled out sinusitis.     Objective:  Neurological Exam  Physical Exam Physical Examination:   Vitals:   02/11/19 1012  BP: (!) 153/74  Pulse: 70  Temp: (!) 97.4 F (36.3 C)    General Examination: The patient is a very pleasant 79 y.o. male in no acute distress. He appears well-developed and well-nourished and well groomed.   HEENT: Normocephalic, atraumatic, pupils are equal, round and reactive to light and accommodation. He is status post bilateral cataract repairs, funduscopic exam is unremarkable, extraocular tracking well preserved without nystagmus.  Hearing is  impaired with bilateral hearing aids in place. Face is symmetric with normal facial animation and normal facial sensation, no tenderness or palpable cord in the temporal area bilaterally.  No lip, neck or jaw tremor.  Airway examination reveals moderate mouth dryness, tongue protrudes centrally and palate elevates symmetrically. Neck is supple with full range of passive and active motion. There are no carotid bruits on auscultation.   Chest: Clear to auscultation without wheezing, rhonchi or crackles noted.  Heart: S1+S2+0, regular and normal without murmurs, rubs or gallops noted.   Abdomen: Soft, non-tender and non-distended with normal bowel sounds appreciated on auscultation.  Extremities: There is no pitting edema in the distal lower extremities bilaterally. Pedal pulses are intact.  Skin: Warm and dry without trophic changes noted.  Musculoskeletal: exam reveals no obvious joint deformities, tenderness or joint swelling or erythema.   Neurologically:  Mental status: The patient is awake, alert and oriented in all 4 spheres. His immediate and remote memory, attention, language skills and fund of knowledge are appropriate. There is no evidence of aphasia, agnosia, apraxia or anomia. Speech is clear with normal prosody and enunciation. Thought process is linear. Mood is normal and affect is normal.  Cranial nerves II - XII are as described above under HEENT exam. In addition: shoulder shrug is normal with equal shoulder height noted. Motor exam: Normal bulk, strength and tone is noted. There is no drift, tremor or rebound. Romberg is negative, With the exception of mild initial sway.  Reflexes are 1+ in the upper extremities, 2+ in both knees and absent in the ankles.  Toes are downgoing bilaterally.  Fine motor skills are well preserved in the upper and lower extremities. Cerebellar testing: No dysmetria or intention tremor on finger to nose testing. Heel to shin is unremarkable bilaterally.  There is no truncal or gait ataxia.  Sensory exam: intact to light touch, vibration, temperature sense in the upper and lower extremities.  Gait, station and balance: He stands easily. No veering to one side is noted. No leaning to one side is noted. Posture is age-appropriate and stance is narrow based. Gait shows normal stride length and normal pace. No problems turning are  noted. Tandem walk is Mildly challenging but overall good for age.   Assessment and Plan:  In summary, Ian [name] is a very pleasant 79 y.o.-year old male with an underlying medical history of hypertension, diabetes, hyperlipidemia, and hearing loss, who Presents for evaluation of his left-sided facial pain which started about 2 to 3 months ago in the jaw area.  His description is not classic for trigeminal neuralgia.  He has no associated visual disturbance, no associated neurological accompaniments, no other widespread neurological complaints.  Examination is benign thankfully.  He has had some symptomatic relief from taking ibuprofen or Excedrin or Tylenol on a nearly daily basis, up to twice daily.He may have some atypical form of facial pain.  I would like to investigate further with a brain MRI with and without contrast.  He had blood work through his primary care physician about a month ago, we will make sure that his kidney function was okay to proceed with a contrasted MRI.  In addition, I would like to just a couple more blood tests today including inflammatory and autoimmune markers.We will call him with the test results. I suggested symptomatic treatment with low-dose gabapentin, 100 mg at bedtime for now. We talked about expectations and limitations and potential side effects of this medication.  He was given written instructions as well.He is advised to follow-up routinely in 3 months, sooner if needed, again, we will keep him posted by phone call as to his blood test results and brain scan results.  I answered all  their questions today and the patient and his wife were in agreement. Thank you very much for allowing me to participate in the care of this nice patient. If I can be of any further assistance to you please do not hesitate to call me at [phone].  Sincerely,   Star Age, MD, PhD

## 2019-02-12 LAB — SEDIMENTATION RATE: Sed Rate: 2 mm/hr (ref 0–30)

## 2019-02-12 LAB — C-REACTIVE PROTEIN: CRP: <1 mg/L (ref 0–10)

## 2019-02-12 LAB — RHEUMATOID FACTOR: Rhuematoid fact SerPl-aCnc: <10 IU/mL (ref 0.0–13.9)

## 2019-02-12 LAB — ANA W/REFLEX: Anti Nuclear Antibody (ANA): NEGATIVE

## 2019-02-13 ENCOUNTER — Telehealth: Payer: Self-pay

## 2019-02-13 NOTE — Progress Notes (Signed)
Labs, including autoimmune screening labs and inflammatory markers are fine, please update patient.

## 2019-02-13 NOTE — Telephone Encounter (Signed)
-----   Message from Star Age, MD sent at 02/13/2019 10:36 AM EST ----- Labs, including autoimmune screening labs and inflammatory markers are fine, please update patient.

## 2019-02-13 NOTE — Telephone Encounter (Signed)
Lvm asking for pt to call back so we could review results.

## 2019-02-14 NOTE — Telephone Encounter (Signed)
I reached out to the pt and advised of results via vm. Pt was advised to call back if he had any questions.

## 2019-02-18 DIAGNOSIS — E1169 Type 2 diabetes mellitus with other specified complication: Secondary | ICD-10-CM | POA: Diagnosis not present

## 2019-02-18 DIAGNOSIS — I10 Essential (primary) hypertension: Secondary | ICD-10-CM | POA: Diagnosis not present

## 2019-02-19 ENCOUNTER — Ambulatory Visit: Payer: Medicare Other | Attending: Internal Medicine

## 2019-02-19 DIAGNOSIS — Z23 Encounter for immunization: Secondary | ICD-10-CM

## 2019-02-19 NOTE — Progress Notes (Signed)
   Covid-19 Vaccination Clinic  Name:  Ian [name]    MRN: [id number] DOB: 12/11/1940  02/19/2019  [name] was observed post Covid-19 immunization for 15 minutes without incidence. He was provided with Vaccine Information Sheet and instruction to access the V-Safe system.   [name] was instructed to call 911 with any severe reactions post vaccine: Marland Kitchen Difficulty breathing  . Swelling of your face and throat  . A fast heartbeat  . A bad rash all over your body  . Dizziness and weakness    Immunizations Administered    Name Date Dose VIS Date Route   Pfizer COVID-19 Vaccine 02/19/2019  8:56 AM 0.3 mL 01/11/2019 Intramuscular   Manufacturer: [place]   Lot: [id number]   NDC: [id number]

## 2019-03-09 ENCOUNTER — Ambulatory Visit: Payer: Medicare Other | Attending: Internal Medicine

## 2019-03-09 DIAGNOSIS — Z23 Encounter for immunization: Secondary | ICD-10-CM | POA: Insufficient documentation

## 2019-03-09 NOTE — Progress Notes (Signed)
   Covid-19 Vaccination Clinic  Name:  Ian [name]    MRN: [id number] DOB: 21-Jan-1941  03/09/2019  [name] was observed post Covid-19 immunization for 15 minutes without incidence. He was provided with Vaccine Information Sheet and instruction to access the V-Safe system.   [name] was instructed to call 911 with any severe reactions post vaccine: Marland Kitchen Difficulty breathing  . Swelling of your face and throat  . A fast heartbeat  . A bad rash all over your body  . Dizziness and weakness    Immunizations Administered    Name Date Dose VIS Date Route   Pfizer COVID-19 Vaccine 03/09/2019 12:36 PM 0.3 mL 01/11/2019 Intramuscular   Manufacturer: [place]   Lot: [id number]   [place]: [id number]

## 2019-03-11 ENCOUNTER — Other Ambulatory Visit: Payer: Self-pay

## 2019-03-11 ENCOUNTER — Ambulatory Visit
Admission: RE | Admit: 2019-03-11 | Discharge: 2019-03-11 | Disposition: A | Discharge status: Home / Self Care | Payer: Medicare Other | Source: Ambulatory Visit | Attending: Neurology | Admitting: Neurology

## 2019-03-11 DIAGNOSIS — R519 Headache, unspecified: Secondary | ICD-10-CM

## 2019-03-11 DIAGNOSIS — G501 Atypical facial pain: Secondary | ICD-10-CM

## 2019-03-11 MED ORDER — GADOBENATE DIMEGLUMINE 529 MG/ML IV SOLN
15.0000 mL | Freq: Once | INTRAVENOUS | Status: AC | PRN
  Administered 2019-03-11: 15 mL via INTRAVENOUS

## 2019-03-13 NOTE — Progress Notes (Signed)
Please advise patient that the brain MRI with and without contrast from 03/13/2019 showed no acute findings and no obvious cause for the left-sided facial pain and headaches. Chronic changes were seen in keeping with age-appropriate findings for the most part.

## 2019-03-14 ENCOUNTER — Telehealth: Payer: Self-pay

## 2019-03-14 NOTE — Telephone Encounter (Signed)
-----   Message from Star Age, MD sent at 03/13/2019  4:56 PM EST ----- Please advise patient that the brain MRI with and without contrast from 03/13/2019 showed no acute findings and no obvious cause for the left-sided facial pain and headaches. Chronic changes were seen in keeping with age-appropriate findings for the most part.

## 2019-03-14 NOTE — Telephone Encounter (Signed)
I contacted the pt and we moved his appt up to 03/25/2019 @ 1030 to discuss recent MRI findings.

## 2019-03-14 NOTE — Telephone Encounter (Signed)
Patient returned called , advised of results , patient wants to know if a sooner appointment can be scheduled to discuss next step

## 2019-03-14 NOTE — Telephone Encounter (Signed)
Lvm asking for a call back from the pt to review MRI results.

## 2019-03-25 ENCOUNTER — Ambulatory Visit (INDEPENDENT_AMBULATORY_CARE_PROVIDER_SITE_OTHER): Payer: Medicare Other | Admitting: Neurology

## 2019-03-25 ENCOUNTER — Encounter: Payer: Self-pay | Admitting: Neurology

## 2019-03-25 ENCOUNTER — Other Ambulatory Visit: Payer: Self-pay

## 2019-03-25 VITALS — BP 144/78 | HR 74 | Temp 97.0°F | Ht 70.0 in | Wt 173.0 lb

## 2019-03-25 DIAGNOSIS — G501 Atypical facial pain: Secondary | ICD-10-CM | POA: Diagnosis not present

## 2019-03-25 NOTE — Patient Instructions (Signed)
As discussed, the gabapentin is low dose.  You may have a better result if you take it consistently over the next several days at least to see if it helps.  Please let us know if you have any side effects.  Please follow-up routinely to see the nurse practitioner in 3 months.  Your brain MRI showed no acute findings and no cause for your headaches/Facial pain.  Your neurological exam is stable and nonfocal.

## 2019-03-25 NOTE — Progress Notes (Signed)
Subjective:    Patient ID: Ian [name] is a 79 y.o. male.  HPI     Interim history:   Mr. Marban is a 79 year old right-handed gentleman with an underlying medical history of hypertension, diabetes, hyperlipidemia, and hearing loss, who presents for follow-up consultation of his left-sided facial pain.  The patient is accompanied by his wife today. I first met him on 02/11/2019 at the request of his ENT specialist, at which time the patient gave a 2 to 52-monthhistory of left-sided facial pain.  The pain description and history were not classic for trigeminal neuralgia.  He was advised to start a trial of gabapentin low-dose.  I suggested we proceed with further work-up in the form of blood work and a brain MRI.  His blood work from 02/11/2019 showed a normal ESR, rheumatoid factor, CRP and ANA. He had a brain MRI with and without contrast on 03/11/2019 and I reviewed the results: IMPRESSION:    Abnormal MRI brain (with and without) demonstrating: - Moderate periventricular and subcortical foci of chronic small vessel ischemic disease.  - No acute findings. We called him with his test results.  Today, 03/25/2019: He reports feeling the same.  He tried gabapentin 2 days only, had read the side effects and was somewhat afraid about side effects.  He did not actually have any adverse effects, did not notice much in the way of difference after 2 days.  He would be willing to retry it.  We reviewed his MRI results.  He has a history of chronic sinus issues and is wondering if he needs to be treated with a strong antibiotic.  He is advised that he saw ENT for this not too long ago and was not advised to proceed with antibiotic treatment.  Nevertheless, he would like to talk to his PCP about this as well.  He tries to hydrate a little better.  He has not had any new symptoms.  The patient's allergies, current medications, family history, past medical history, past social history, past surgical history  and problem list were reviewed and updated as appropriate.   Previously:   02/11/2019: (He) reports a 2-3 mo history of left-sided facial pain.  The pain is more noticeable when he bends down or when he first lays back to sleep at night.  Seems to be worse at night.  He has been taking ibuprofen up to twice daily or Tylenol or Excedrin up to twice daily, feels like his pain level goes down to about a 3 out of 10 versus without medication up to 8 out of 10.  He denies any shocklike or electric-like jolts of pain, no obvious triggers such as chewing or talking or toothbrushing. Symptoms started in the jaw area which is why he first saw his dentist.  He reports that he had x-rays which did not show any evidence of abscess or any dental problem. He sometimes has a throbbing sensation and has no prior history of migraine headaches, no history of visual disturbance, has an eye examination twice a year, denies any numbness and any other neurological symptoms elsewhere.  He had rotator cuff surgery on the right successfully some 9 or 10 months ago, has finished physical therapy and did well overall.  He is hard of hearing, has bilateral hearing aids.  He had cataract repairs bilaterally.  He denies any widespread joint pain, no tenderness in the scalp.  Denies any symptoms of peripheral neuropathy, last A1c per his report was 6.8, he  had blood work with his PCP about a month ago. He has a history of vertigo.  Balance is not always as good.  He denies smoking, drinks alcohol rarely, caffeine in the form of coffee, 2 to 3 cups in the mornings and otherwise no caffeine the rest of the day.  He does not always hydrate well, admits to drinking maybe 3 cups of water per day, wife estimates up to 4/day. He had had a CT of the paranasal sinuses without contrast on 02/05/2019 and I reviewed the results: Impression: Mucosal edema in the frontal, maxillary and ethmoid sinuses bilaterally without air-fluid level.  Narrowing of the  ostiomeatal complex bilaterally due to [facility] cells and mucosal edema. I reviewed your office note from 11/14/2017, as well as 02/05/2019.   His Past Medical History Is Significant For: Past Medical History:  Diagnosis Date  . Diabetes mellitus without complication ([place])   . Enlarged prostate   . GERD (gastroesophageal reflux disease)    occasional zantac  . Hyperlipidemia   . Hypertension   . Kidney stone   . Sinus problem     His Past Surgical History Is Significant For: Past Surgical History:  Procedure Laterality Date  . cataracts removed  2014  . CYSTOSCOPY WITH RETROGRADE PYELOGRAM, URETEROSCOPY AND STENT PLACEMENT Left 02/20/2014   Procedure: CYSTOSCOPY WITH RETROGRADE PYELOGRAM, URETEROSCOPY AND STENT PLACEMENT;  Surgeon: Raynelle Bring, MD;  Location: [facility];  Service: Urology;  Laterality: Left;  . HOLMIUM LASER APPLICATION Left 09/13/4816   Procedure: HOLMIUM LASER APPLICATION;  Surgeon: Raynelle Bring, MD;  Location: [facility];  Service: Urology;  Laterality: Left;  . LITHOTRIPSY     x4 >12 yrs ago    His Family History Is Significant For: No family history on file.  His Social History Is Significant For: Social History   Socioeconomic History  . Marital status: Married    Spouse name: Not on file  . Number of children: Not on file  . Years of education: Not on file  . Highest education level: Not on file  Occupational History  . Not on file  Tobacco Use  . Smoking status: Never Smoker  . Smokeless tobacco: Never Used  Substance and Sexual Activity  . Alcohol use: Yes    Comment: Occational glass of wine   . Drug use: No  . Sexual activity: Not on file  Other Topics Concern  . Not on file  Social History Narrative  . Not on file   Social Determinants of Health   Financial Resource Strain:   . Difficulty of Paying Living Expenses: Not on file  Food Insecurity:   . Worried About Charity fundraiser in the Last Year: Not on file  . Ran Out of Food in the  Last Year: Not on file  Transportation Needs:   . Lack of Transportation (Medical): Not on file  . Lack of Transportation (Non-Medical): Not on file  Physical Activity:   . Days of Exercise per Week: Not on file  . Minutes of Exercise per Session: Not on file  Stress:   . Feeling of Stress : Not on file  Social Connections:   . Frequency of Communication with Friends and Family: Not on file  . Frequency of Social Gatherings with Friends and Family: Not on file  . Attends Religious Services: Not on file  . Active Member of Clubs or Organizations: Not on file  . Attends Archivist Meetings: Not on file  . Marital Status:  Not on file    His Allergies Are:  No Known Allergies:   His Current Medications Are:  Outpatient Encounter Medications as of 03/25/2019  Medication Sig  . amLODipine (NORVASC) 5 MG tablet 5 mg daily.   . carvedilol (COREG) 6.25 MG tablet Take 6.25 mg by mouth 2 (two) times daily.  . cholecalciferol (VITAMIN D) 1000 UNITS tablet Take 1,000 Units by mouth daily.  . Cinnamon 500 MG capsule Cinnamon  . Coenzyme Q10 (COQ-10) 100 MG CAPS CoQ-10  . finasteride (PROSCAR) 5 MG tablet Take 5 mg by mouth daily.  Marland Kitchen loratadine (CLARITIN) 10 MG tablet Take 10 mg by mouth daily as needed for allergies.  Marland Kitchen meclizine (ANTIVERT) 25 MG tablet Take 2 tablets by mouth 3 (three) times daily as needed for dizziness.   . metFORMIN (GLUCOPHAGE) 500 MG tablet Take 500 mg by mouth 2 (two) times daily with a meal.  . Omega-3 Fatty Acids (RA FISH OIL) 1000 MG CAPS Fish Oil  . rosuvastatin (CRESTOR) 10 MG tablet Take 10 mg by mouth daily.  . tamsulosin (FLOMAX) 0.4 MG CAPS capsule Take 0.4 mg by mouth daily.  . [DISCONTINUED] gabapentin (NEURONTIN) 100 MG capsule Take 1 capsule (100 mg total) by mouth at bedtime.  . [DISCONTINUED] HYDROcodone-acetaminophen (NORCO/VICODIN) 5-325 MG per tablet Take 1-2 tablets by mouth every 6 (six) hours as needed.   No facility-administered  encounter medications on file as of 03/25/2019.  :  Review of Systems:  Out of a complete 14 point review of systems, all are reviewed and negative with the exception of these symptoms as listed below: Review of Systems  Neurological:       Here to f/u on recent MRI test. Would like to discuss findings and next steps.     Objective:  Neurological Exam  Physical Exam Physical Examination:   Vitals:   03/25/19 1019  BP: (!) 144/78  Pulse: 74  Temp: (!) 97 F (36.1 C)  SpO2: 98%    General Examination: The patient is a very pleasant 79 y.o. male in no acute distress. He appears well-developed and well-nourished and well groomed.   HEENT: Normocephalic, atraumatic, pupils are equal, round and reactive to light , status post bilateral cataract repairs, extraocular tracking well preserved without nystagmus.  Hearing is impaired with bilateral hearing aids in place. Face is symmetric with normal facial animation and normal facial sensation, no tenderness or palpable cord in the temporal area bilaterally.  No lip, neck or jaw tremor.  Airway examination reveals moderate mouth dryness, tongue protrudes centrally and palate elevates symmetrically. Neck is supple with full range of passive and active motion. There are no carotid bruits on auscultation.   Chest: Clear to auscultation without wheezing, rhonchi or crackles noted.  Heart: S1+S2+0, regular and normal without murmurs, rubs or gallops noted.   Abdomen: Soft, non-tender and non-distended with normal bowel sounds appreciated on auscultation.  Extremities: There is no pitting edema in the distal lower extremities bilaterally.   Skin: Warm and dry without trophic changes noted.  Musculoskeletal: exam reveals no obvious joint deformities, tenderness or joint swelling or erythema.   Neurologically:  Mental status: The patient is awake, alert and oriented in all 4 spheres. His immediate and remote memory, attention, language  skills and fund of knowledge are appropriate. There is no evidence of aphasia, agnosia, apraxia or anomia. Speech is clear with normal prosody and enunciation. Thought process is linear. Mood is normal and affect is normal.  Cranial nerves II -  XII are as described above under HEENT exam. In addition: shoulder shrug is normal with equal shoulder height noted. Motor exam: Normal bulk, strength and tone is noted. There is no drift, tremor. Reflexes are 1+ in the upper extremities, 2+ in both knees and absent in the ankles. Fine motor skills are well preserved in the upper and lower extremities. Cerebellar testing: No dysmetria or intention tremor. There is no truncal or gait ataxia.  Sensory exam: intact to light touch in the upper and lower extremities.  Gait, station and balance: He stands easily. No veering to one side is noted. No leaning to one side is noted. Posture is age-appropriate and stance is narrow based. Gait shows normal stride length and normal pace. No problems turning are noted.   Assessment and Plan:  In summary, OSHAE SIMMERING is a very pleasant 79 year old male with an underlying medical history of hypertension, diabetes, hyperlipidemia, and hearing loss, who Presents for Follow-up consultation of his left facial pain of approximately 3 to 4 months duration.  He has a nonfocal neurological exam, MRI brain earlier this month showed no acute findings and no structural cause for his symptoms.  He had tried gabapentin, But only took it for 2 days and did not see any benefit.  He is encouraged to try it again and use it more consistently.  He would be willing to do so.  He is advised to follow-up routinely in 3 months to see the nurse practitioner. Blood test for inflammatory and autoimmune markers were negative in January 2021. I answered all their questions today and the patient and his wife were in agreement.

## 2019-05-08 DIAGNOSIS — I10 Essential (primary) hypertension: Secondary | ICD-10-CM | POA: Diagnosis not present

## 2019-05-08 DIAGNOSIS — E1121 Type 2 diabetes mellitus with diabetic nephropathy: Secondary | ICD-10-CM | POA: Diagnosis not present

## 2019-05-08 DIAGNOSIS — E1169 Type 2 diabetes mellitus with other specified complication: Secondary | ICD-10-CM | POA: Diagnosis not present

## 2019-05-08 DIAGNOSIS — E782 Mixed hyperlipidemia: Secondary | ICD-10-CM | POA: Diagnosis not present

## 2019-05-13 ENCOUNTER — Ambulatory Visit: Payer: Medicare Other | Admitting: Neurology

## 2019-05-22 DIAGNOSIS — E1169 Type 2 diabetes mellitus with other specified complication: Secondary | ICD-10-CM | POA: Diagnosis not present

## 2019-05-22 DIAGNOSIS — I10 Essential (primary) hypertension: Secondary | ICD-10-CM | POA: Diagnosis not present

## 2019-05-22 DIAGNOSIS — E782 Mixed hyperlipidemia: Secondary | ICD-10-CM | POA: Diagnosis not present

## 2019-05-22 DIAGNOSIS — E1129 Type 2 diabetes mellitus with other diabetic kidney complication: Secondary | ICD-10-CM | POA: Diagnosis not present

## 2019-05-22 DIAGNOSIS — N400 Enlarged prostate without lower urinary tract symptoms: Secondary | ICD-10-CM | POA: Diagnosis not present

## 2019-05-22 DIAGNOSIS — R809 Proteinuria, unspecified: Secondary | ICD-10-CM | POA: Diagnosis not present

## 2019-05-22 DIAGNOSIS — E1121 Type 2 diabetes mellitus with diabetic nephropathy: Secondary | ICD-10-CM | POA: Diagnosis not present

## 2019-06-20 ENCOUNTER — Ambulatory Visit: Payer: Medicare Other | Admitting: Family Medicine

## 2019-07-10 DIAGNOSIS — L570 Actinic keratosis: Secondary | ICD-10-CM | POA: Diagnosis not present

## 2019-07-10 DIAGNOSIS — Z85828 Personal history of other malignant neoplasm of skin: Secondary | ICD-10-CM | POA: Diagnosis not present

## 2019-07-10 DIAGNOSIS — Z08 Encounter for follow-up examination after completed treatment for malignant neoplasm: Secondary | ICD-10-CM | POA: Diagnosis not present

## 2019-07-10 DIAGNOSIS — X32XXXD Exposure to sunlight, subsequent encounter: Secondary | ICD-10-CM | POA: Diagnosis not present

## 2019-07-10 DIAGNOSIS — L820 Inflamed seborrheic keratosis: Secondary | ICD-10-CM | POA: Diagnosis not present

## 2019-07-10 DIAGNOSIS — L821 Other seborrheic keratosis: Secondary | ICD-10-CM | POA: Diagnosis not present

## 2019-09-13 DIAGNOSIS — Z6824 Body mass index (BMI) 24.0-24.9, adult: Secondary | ICD-10-CM | POA: Diagnosis not present

## 2019-09-13 DIAGNOSIS — Z20822 Contact with and (suspected) exposure to covid-19: Secondary | ICD-10-CM | POA: Diagnosis not present

## 2019-10-08 DIAGNOSIS — E1129 Type 2 diabetes mellitus with other diabetic kidney complication: Secondary | ICD-10-CM | POA: Diagnosis not present

## 2019-10-08 DIAGNOSIS — E782 Mixed hyperlipidemia: Secondary | ICD-10-CM | POA: Diagnosis not present

## 2019-10-08 DIAGNOSIS — E1121 Type 2 diabetes mellitus with diabetic nephropathy: Secondary | ICD-10-CM | POA: Diagnosis not present

## 2019-10-08 DIAGNOSIS — E1169 Type 2 diabetes mellitus with other specified complication: Secondary | ICD-10-CM | POA: Diagnosis not present

## 2019-10-08 DIAGNOSIS — I10 Essential (primary) hypertension: Secondary | ICD-10-CM | POA: Diagnosis not present

## 2019-10-11 DIAGNOSIS — N401 Enlarged prostate with lower urinary tract symptoms: Secondary | ICD-10-CM | POA: Diagnosis not present

## 2019-10-11 DIAGNOSIS — R351 Nocturia: Secondary | ICD-10-CM | POA: Diagnosis not present

## 2019-10-11 DIAGNOSIS — R3121 Asymptomatic microscopic hematuria: Secondary | ICD-10-CM | POA: Diagnosis not present

## 2019-10-15 DIAGNOSIS — E1169 Type 2 diabetes mellitus with other specified complication: Secondary | ICD-10-CM | POA: Diagnosis not present

## 2019-10-15 DIAGNOSIS — I10 Essential (primary) hypertension: Secondary | ICD-10-CM | POA: Diagnosis not present

## 2019-10-15 DIAGNOSIS — Z23 Encounter for immunization: Secondary | ICD-10-CM | POA: Diagnosis not present

## 2019-10-15 DIAGNOSIS — W460XXA Contact with hypodermic needle, initial encounter: Secondary | ICD-10-CM | POA: Diagnosis not present

## 2019-10-15 DIAGNOSIS — E1121 Type 2 diabetes mellitus with diabetic nephropathy: Secondary | ICD-10-CM | POA: Diagnosis not present

## 2019-10-15 DIAGNOSIS — R519 Headache, unspecified: Secondary | ICD-10-CM | POA: Diagnosis not present

## 2019-10-15 DIAGNOSIS — E782 Mixed hyperlipidemia: Secondary | ICD-10-CM | POA: Diagnosis not present

## 2019-10-15 DIAGNOSIS — E1129 Type 2 diabetes mellitus with other diabetic kidney complication: Secondary | ICD-10-CM | POA: Diagnosis not present

## 2019-11-11 DIAGNOSIS — R3121 Asymptomatic microscopic hematuria: Secondary | ICD-10-CM | POA: Diagnosis not present

## 2019-11-12 DIAGNOSIS — R519 Headache, unspecified: Secondary | ICD-10-CM | POA: Diagnosis not present

## 2019-11-21 DIAGNOSIS — Z1152 Encounter for screening for COVID-19: Secondary | ICD-10-CM | POA: Diagnosis not present

## 2019-12-09 DIAGNOSIS — N200 Calculus of kidney: Secondary | ICD-10-CM | POA: Diagnosis not present

## 2019-12-09 DIAGNOSIS — R3121 Asymptomatic microscopic hematuria: Secondary | ICD-10-CM | POA: Diagnosis not present

## 2019-12-09 DIAGNOSIS — K4090 Unilateral inguinal hernia, without obstruction or gangrene, not specified as recurrent: Secondary | ICD-10-CM | POA: Diagnosis not present

## 2019-12-09 DIAGNOSIS — N201 Calculus of ureter: Secondary | ICD-10-CM | POA: Diagnosis not present

## 2019-12-09 DIAGNOSIS — I714 Abdominal aortic aneurysm, without rupture: Secondary | ICD-10-CM | POA: Diagnosis not present

## 2019-12-10 ENCOUNTER — Other Ambulatory Visit: Payer: Self-pay | Admitting: Urology

## 2019-12-10 DIAGNOSIS — R3121 Asymptomatic microscopic hematuria: Secondary | ICD-10-CM | POA: Diagnosis not present

## 2019-12-10 DIAGNOSIS — N201 Calculus of ureter: Secondary | ICD-10-CM | POA: Diagnosis not present

## 2019-12-11 ENCOUNTER — Other Ambulatory Visit: Payer: Self-pay | Admitting: Urology

## 2019-12-11 DIAGNOSIS — Z23 Encounter for immunization: Secondary | ICD-10-CM | POA: Diagnosis not present

## 2019-12-14 ENCOUNTER — Ambulatory Visit: Payer: Medicare Other

## 2019-12-16 ENCOUNTER — Other Ambulatory Visit (HOSPITAL_COMMUNITY)
Admission: RE | Admit: 2019-12-16 | Discharge: 2019-12-16 | Disposition: A | Discharge status: Home / Self Care | Payer: Medicare Other | Source: Ambulatory Visit | Attending: Urology | Admitting: Urology

## 2019-12-16 DIAGNOSIS — Z20822 Contact with and (suspected) exposure to covid-19: Secondary | ICD-10-CM | POA: Insufficient documentation

## 2019-12-16 DIAGNOSIS — Z01812 Encounter for preprocedural laboratory examination: Secondary | ICD-10-CM | POA: Diagnosis not present

## 2019-12-16 LAB — SARS CORONAVIRUS 2 (TAT 6-24 HRS): SARS Coronavirus 2: NEGATIVE

## 2019-12-18 NOTE — Progress Notes (Signed)
Talked with Ian [name], wife. Pt is HOH. Instructions given. Clear liquids until 1000 am. Arrival time 1200 wife is the driver

## 2019-12-19 ENCOUNTER — Encounter (HOSPITAL_BASED_OUTPATIENT_CLINIC_OR_DEPARTMENT_OTHER)
Admission: RE | Disposition: A | Discharge status: Home / Self Care | Payer: Self-pay | Source: Home / Self Care | Attending: Urology

## 2019-12-19 ENCOUNTER — Encounter (HOSPITAL_BASED_OUTPATIENT_CLINIC_OR_DEPARTMENT_OTHER): Payer: Self-pay | Admitting: Urology

## 2019-12-19 ENCOUNTER — Ambulatory Visit (HOSPITAL_BASED_OUTPATIENT_CLINIC_OR_DEPARTMENT_OTHER)
Admission: RE | Admit: 2019-12-19 | Discharge: 2019-12-19 | Disposition: A | Discharge status: Home / Self Care | Payer: Medicare Other | Attending: Urology | Admitting: Urology

## 2019-12-19 ENCOUNTER — Ambulatory Visit (HOSPITAL_COMMUNITY): Payer: Medicare Other

## 2019-12-19 ENCOUNTER — Other Ambulatory Visit: Payer: Self-pay

## 2019-12-19 DIAGNOSIS — Z01818 Encounter for other preprocedural examination: Secondary | ICD-10-CM | POA: Diagnosis not present

## 2019-12-19 DIAGNOSIS — E119 Type 2 diabetes mellitus without complications: Secondary | ICD-10-CM | POA: Insufficient documentation

## 2019-12-19 DIAGNOSIS — I878 Other specified disorders of veins: Secondary | ICD-10-CM | POA: Diagnosis not present

## 2019-12-19 DIAGNOSIS — N201 Calculus of ureter: Secondary | ICD-10-CM | POA: Diagnosis not present

## 2019-12-19 DIAGNOSIS — I10 Essential (primary) hypertension: Secondary | ICD-10-CM | POA: Insufficient documentation

## 2019-12-19 DIAGNOSIS — N2889 Other specified disorders of kidney and ureter: Secondary | ICD-10-CM | POA: Diagnosis not present

## 2019-12-19 DIAGNOSIS — N200 Calculus of kidney: Secondary | ICD-10-CM | POA: Diagnosis not present

## 2019-12-19 HISTORY — PX: EXTRACORPOREAL SHOCK WAVE LITHOTRIPSY: SHX1557

## 2019-12-19 LAB — GLUCOSE, CAPILLARY: Glucose-Capillary: 92 mg/dL (ref 70–99)

## 2019-12-19 SURGERY — LITHOTRIPSY, ESWL
Anesthesia: LOCAL | Laterality: Right

## 2019-12-19 MED ORDER — DIPHENHYDRAMINE HCL 25 MG PO CAPS
ORAL_CAPSULE | ORAL | Status: AC
  Filled 2019-12-19: qty 1

## 2019-12-19 MED ORDER — DIPHENHYDRAMINE HCL 25 MG PO CAPS
25.0000 mg | ORAL_CAPSULE | ORAL | Status: AC
  Administered 2019-12-19: 25 mg via ORAL

## 2019-12-19 MED ORDER — HYDROCODONE-ACETAMINOPHEN 5-325 MG PO TABS: 1.0000 | ORAL_TABLET | ORAL | 0 refills | Status: AC | PRN

## 2019-12-19 MED ORDER — DIAZEPAM 5 MG PO TABS
10.0000 mg | ORAL_TABLET | ORAL | Status: AC
  Administered 2019-12-19: 10 mg via ORAL

## 2019-12-19 MED ORDER — CIPROFLOXACIN HCL 500 MG PO TABS
500.0000 mg | ORAL_TABLET | ORAL | Status: AC
  Administered 2019-12-19: 500 mg via ORAL

## 2019-12-19 MED ORDER — DIAZEPAM 5 MG PO TABS
ORAL_TABLET | ORAL | Status: AC
  Filled 2019-12-19: qty 2

## 2019-12-19 MED ORDER — SODIUM CHLORIDE 0.9 % IV SOLN
INTRAVENOUS | Status: DC
  Administered 2019-12-19: 1000 mL via INTRAVENOUS

## 2019-12-19 MED ORDER — CIPROFLOXACIN HCL 500 MG PO TABS
ORAL_TABLET | ORAL | Status: AC
  Filled 2019-12-19: qty 1

## 2019-12-19 NOTE — Op Note (Signed)
See [facility] OP note scanned into chart. Also because of the size, density, location and other factors that cannot be anticipated I feel this will likely be a staged procedure. This fact supersedes any indication in the scanned [facility] operative note to the contrary.  

## 2019-12-19 NOTE — H&P (Signed)
See [facility] scanned into chart 

## 2019-12-19 NOTE — Discharge Instructions (Signed)
Lithotripsy, Care After This sheet gives you information about how to care for yourself after your procedure. Your health care provider may also give you more specific instructions. If you have problems or questions, contact your health care provider. What can I expect after the procedure? After the procedure, it is common to have:  Some blood in your urine. This should only last for a few days.  Soreness in your back, sides, or upper abdomen for a few days.  Blotches or bruises on your back where the pressure wave entered the skin.  Pain, discomfort, or nausea when pieces (fragments) of the kidney stone move through the tube that carries urine from the kidney to the bladder (ureter). Stone fragments may pass soon after the procedure, but they may continue to pass for up to 4-8 weeks. ? If you have severe pain or nausea, contact your health care provider. This may be caused by a large stone that was not broken up, and this may mean that you need more treatment.  Some pain or discomfort during urination.  Some pain or discomfort in the lower abdomen or (in men) at the base of the penis. Follow these instructions at home: Medicines  Take over-the-counter and prescription medicines only as told by your health care provider.  If you were prescribed an antibiotic medicine, take it as told by your health care provider. Do not stop taking the antibiotic even if you start to feel better.  Do not drive for 24 hours if you were given a medicine to help you relax (sedative).  Do not drive or use heavy machinery while taking prescription pain medicine. Eating and drinking      Drink enough water and fluids to keep your urine clear or pale yellow. This helps any remaining pieces of the stone to pass. It can also help prevent new stones from forming.  Eat plenty of fresh fruits and vegetables.  Follow instructions from your health care provider about eating and drinking restrictions. You may be  instructed: ? To reduce how much salt (sodium) you eat or drink. Check ingredients and nutrition facts on packaged foods and beverages. ? To reduce how much meat you eat.  Eat the recommended amount of calcium for your age and gender. Ask your health care provider how much calcium you should have. General instructions  Get plenty of rest.  Most people can resume normal activities 1-2 days after the procedure. Ask your health care provider what activities are safe for you.  Your health care provider may direct you to lie in a certain position (postural drainage) and tap firmly (percuss) over your kidney area to help stone fragments pass. Follow instructions as told by your health care provider.  If directed, strain all urine through the strainer that was provided by your health care provider. ? Keep all fragments for your health care provider to see. Any stones that are found may be sent to a medical lab for examination. The stone may be as small as a grain of salt.  Keep all follow-up visits as told by your health care provider. This is important. Contact a health care provider if:  You have pain that is severe or does not get better with medicine.  You have nausea that is severe or does not go away.  You have blood in your urine longer than your health care provider told you to expect.  You have more blood in your urine.  You have pain during urination that does   not go away.  You urinate more frequently than usual and this does not go away.  You develop a rash or any other possible signs of an allergic reaction. Get help right away if:  You have severe pain in your back, sides, or upper abdomen.  You have severe pain while urinating.  Your urine is very dark red.  You have blood in your stool (feces).  You cannot pass any urine at all.  You feel a strong urge to urinate after emptying your bladder.  You have a fever or chills.  You develop shortness of breath,  difficulty breathing, or chest pain.  You have severe nausea that leads to persistent vomiting.  You faint. Summary  After this procedure, it is common to have some pain, discomfort, or nausea when pieces (fragments) of the kidney stone move through the tube that carries urine from the kidney to the bladder (ureter). If this pain or nausea is severe, however, you should contact your health care provider.  Most people can resume normal activities 1-2 days after the procedure. Ask your health care provider what activities are safe for you.  Drink enough water and fluids to keep your urine clear or pale yellow. This helps any remaining pieces of the stone to pass, and it can help prevent new stones from forming.  If directed, strain your urine and keep all fragments for your health care provider to see. Fragments or stones may be as small as a grain of salt.  Get help right away if you have severe pain in your back, sides, or upper abdomen or have severe pain while urinating. This information is not intended to replace advice given to you by your health care provider. Make sure you discuss any questions you have with your health care provider. Document Revised: 04/30/2018 Document Reviewed: 12/09/2015 Elsevier Patient Education  2020 [place] discharge instructions in chart.

## 2019-12-20 ENCOUNTER — Encounter (HOSPITAL_BASED_OUTPATIENT_CLINIC_OR_DEPARTMENT_OTHER): Payer: Self-pay | Admitting: Urology

## 2019-12-30 DIAGNOSIS — N201 Calculus of ureter: Secondary | ICD-10-CM | POA: Diagnosis not present

## 2020-01-15 DIAGNOSIS — X32XXXD Exposure to sunlight, subsequent encounter: Secondary | ICD-10-CM | POA: Diagnosis not present

## 2020-01-15 DIAGNOSIS — L570 Actinic keratosis: Secondary | ICD-10-CM | POA: Diagnosis not present

## 2020-02-10 DIAGNOSIS — Z20822 Contact with and (suspected) exposure to covid-19: Secondary | ICD-10-CM | POA: Diagnosis not present

## 2020-02-10 DIAGNOSIS — Z1152 Encounter for screening for COVID-19: Secondary | ICD-10-CM | POA: Diagnosis not present

## 2020-03-10 DIAGNOSIS — E119 Type 2 diabetes mellitus without complications: Secondary | ICD-10-CM | POA: Diagnosis not present

## 2020-03-10 DIAGNOSIS — H5213 Myopia, bilateral: Secondary | ICD-10-CM | POA: Diagnosis not present

## 2020-03-10 DIAGNOSIS — H524 Presbyopia: Secondary | ICD-10-CM | POA: Diagnosis not present

## 2020-03-16 DIAGNOSIS — I10 Essential (primary) hypertension: Secondary | ICD-10-CM | POA: Diagnosis not present

## 2020-03-16 DIAGNOSIS — E1121 Type 2 diabetes mellitus with diabetic nephropathy: Secondary | ICD-10-CM | POA: Diagnosis not present

## 2020-03-16 DIAGNOSIS — N390 Urinary tract infection, site not specified: Secondary | ICD-10-CM | POA: Diagnosis not present

## 2020-03-16 DIAGNOSIS — Z0000 Encounter for general adult medical examination without abnormal findings: Secondary | ICD-10-CM | POA: Diagnosis not present

## 2020-03-16 DIAGNOSIS — E1169 Type 2 diabetes mellitus with other specified complication: Secondary | ICD-10-CM | POA: Diagnosis not present

## 2020-03-16 DIAGNOSIS — E782 Mixed hyperlipidemia: Secondary | ICD-10-CM | POA: Diagnosis not present

## 2020-03-16 DIAGNOSIS — E1129 Type 2 diabetes mellitus with other diabetic kidney complication: Secondary | ICD-10-CM | POA: Diagnosis not present

## 2020-03-23 DIAGNOSIS — E1122 Type 2 diabetes mellitus with diabetic chronic kidney disease: Secondary | ICD-10-CM | POA: Diagnosis not present

## 2020-03-23 DIAGNOSIS — I10 Essential (primary) hypertension: Secondary | ICD-10-CM | POA: Diagnosis not present

## 2020-03-23 DIAGNOSIS — N1831 Chronic kidney disease, stage 3a: Secondary | ICD-10-CM | POA: Diagnosis not present

## 2020-03-23 DIAGNOSIS — E782 Mixed hyperlipidemia: Secondary | ICD-10-CM | POA: Diagnosis not present

## 2020-03-23 DIAGNOSIS — N390 Urinary tract infection, site not specified: Secondary | ICD-10-CM | POA: Diagnosis not present

## 2020-03-23 DIAGNOSIS — I700 Atherosclerosis of aorta: Secondary | ICD-10-CM | POA: Diagnosis not present

## 2020-03-23 DIAGNOSIS — R809 Proteinuria, unspecified: Secondary | ICD-10-CM | POA: Diagnosis not present

## 2020-04-03 DIAGNOSIS — N390 Urinary tract infection, site not specified: Secondary | ICD-10-CM | POA: Diagnosis not present

## 2020-04-15 DIAGNOSIS — R8271 Bacteriuria: Secondary | ICD-10-CM | POA: Diagnosis not present

## 2020-04-15 DIAGNOSIS — R351 Nocturia: Secondary | ICD-10-CM | POA: Diagnosis not present

## 2020-04-15 DIAGNOSIS — N200 Calculus of kidney: Secondary | ICD-10-CM | POA: Diagnosis not present

## 2020-04-15 DIAGNOSIS — N401 Enlarged prostate with lower urinary tract symptoms: Secondary | ICD-10-CM | POA: Diagnosis not present

## 2020-05-02 DIAGNOSIS — Z20822 Contact with and (suspected) exposure to covid-19: Secondary | ICD-10-CM | POA: Diagnosis not present

## 2020-07-24 DIAGNOSIS — Z20822 Contact with and (suspected) exposure to covid-19: Secondary | ICD-10-CM | POA: Diagnosis not present

## 2020-08-25 DIAGNOSIS — Z20822 Contact with and (suspected) exposure to covid-19: Secondary | ICD-10-CM | POA: Diagnosis not present

## 2020-10-09 DIAGNOSIS — Z1152 Encounter for screening for COVID-19: Secondary | ICD-10-CM | POA: Diagnosis not present

## 2020-10-21 DIAGNOSIS — E119 Type 2 diabetes mellitus without complications: Secondary | ICD-10-CM | POA: Diagnosis not present

## 2020-10-21 DIAGNOSIS — E782 Mixed hyperlipidemia: Secondary | ICD-10-CM | POA: Diagnosis not present

## 2020-10-21 DIAGNOSIS — I10 Essential (primary) hypertension: Secondary | ICD-10-CM | POA: Diagnosis not present

## 2020-10-22 DIAGNOSIS — H40012 Open angle with borderline findings, low risk, left eye: Secondary | ICD-10-CM | POA: Diagnosis not present

## 2020-10-22 DIAGNOSIS — E119 Type 2 diabetes mellitus without complications: Secondary | ICD-10-CM | POA: Diagnosis not present

## 2020-10-28 DIAGNOSIS — E1122 Type 2 diabetes mellitus with diabetic chronic kidney disease: Secondary | ICD-10-CM | POA: Diagnosis not present

## 2020-10-28 DIAGNOSIS — N400 Enlarged prostate without lower urinary tract symptoms: Secondary | ICD-10-CM | POA: Diagnosis not present

## 2020-10-28 DIAGNOSIS — I10 Essential (primary) hypertension: Secondary | ICD-10-CM | POA: Diagnosis not present

## 2020-10-28 DIAGNOSIS — Z23 Encounter for immunization: Secondary | ICD-10-CM | POA: Diagnosis not present

## 2020-10-28 DIAGNOSIS — I700 Atherosclerosis of aorta: Secondary | ICD-10-CM | POA: Diagnosis not present

## 2020-10-28 DIAGNOSIS — E782 Mixed hyperlipidemia: Secondary | ICD-10-CM | POA: Diagnosis not present

## 2021-04-01 DIAGNOSIS — N400 Enlarged prostate without lower urinary tract symptoms: Secondary | ICD-10-CM | POA: Diagnosis not present

## 2021-04-01 DIAGNOSIS — E1122 Type 2 diabetes mellitus with diabetic chronic kidney disease: Secondary | ICD-10-CM | POA: Diagnosis not present

## 2021-04-01 DIAGNOSIS — E782 Mixed hyperlipidemia: Secondary | ICD-10-CM | POA: Diagnosis not present

## 2021-04-01 DIAGNOSIS — Z23 Encounter for immunization: Secondary | ICD-10-CM | POA: Diagnosis not present

## 2021-04-01 DIAGNOSIS — I700 Atherosclerosis of aorta: Secondary | ICD-10-CM | POA: Diagnosis not present

## 2021-04-01 DIAGNOSIS — I10 Essential (primary) hypertension: Secondary | ICD-10-CM | POA: Diagnosis not present

## 2021-04-01 DIAGNOSIS — Z0000 Encounter for general adult medical examination without abnormal findings: Secondary | ICD-10-CM | POA: Diagnosis not present

## 2021-04-01 DIAGNOSIS — R5383 Other fatigue: Secondary | ICD-10-CM | POA: Diagnosis not present

## 2021-04-08 DIAGNOSIS — N1831 Chronic kidney disease, stage 3a: Secondary | ICD-10-CM | POA: Diagnosis not present

## 2021-04-08 DIAGNOSIS — N400 Enlarged prostate without lower urinary tract symptoms: Secondary | ICD-10-CM | POA: Diagnosis not present

## 2021-04-08 DIAGNOSIS — E1121 Type 2 diabetes mellitus with diabetic nephropathy: Secondary | ICD-10-CM | POA: Diagnosis not present

## 2021-04-08 DIAGNOSIS — E782 Mixed hyperlipidemia: Secondary | ICD-10-CM | POA: Diagnosis not present

## 2021-04-08 DIAGNOSIS — N390 Urinary tract infection, site not specified: Secondary | ICD-10-CM | POA: Diagnosis not present

## 2021-04-08 DIAGNOSIS — R809 Proteinuria, unspecified: Secondary | ICD-10-CM | POA: Diagnosis not present

## 2021-04-08 DIAGNOSIS — I10 Essential (primary) hypertension: Secondary | ICD-10-CM | POA: Diagnosis not present

## 2021-04-08 DIAGNOSIS — I700 Atherosclerosis of aorta: Secondary | ICD-10-CM | POA: Diagnosis not present

## 2021-04-16 DIAGNOSIS — N401 Enlarged prostate with lower urinary tract symptoms: Secondary | ICD-10-CM | POA: Diagnosis not present

## 2021-04-16 DIAGNOSIS — N3000 Acute cystitis without hematuria: Secondary | ICD-10-CM | POA: Diagnosis not present

## 2021-04-16 DIAGNOSIS — N200 Calculus of kidney: Secondary | ICD-10-CM | POA: Diagnosis not present

## 2021-04-16 DIAGNOSIS — R351 Nocturia: Secondary | ICD-10-CM | POA: Diagnosis not present

## 2021-04-29 DIAGNOSIS — H40012 Open angle with borderline findings, low risk, left eye: Secondary | ICD-10-CM | POA: Diagnosis not present

## 2021-04-29 DIAGNOSIS — H43813 Vitreous degeneration, bilateral: Secondary | ICD-10-CM | POA: Diagnosis not present

## 2021-04-29 DIAGNOSIS — E119 Type 2 diabetes mellitus without complications: Secondary | ICD-10-CM | POA: Diagnosis not present

## 2021-04-29 DIAGNOSIS — H5213 Myopia, bilateral: Secondary | ICD-10-CM | POA: Diagnosis not present

## 2021-06-29 DIAGNOSIS — L820 Inflamed seborrheic keratosis: Secondary | ICD-10-CM | POA: Diagnosis not present

## 2021-06-29 DIAGNOSIS — L570 Actinic keratosis: Secondary | ICD-10-CM | POA: Diagnosis not present

## 2021-06-29 DIAGNOSIS — X32XXXD Exposure to sunlight, subsequent encounter: Secondary | ICD-10-CM | POA: Diagnosis not present

## 2021-06-29 DIAGNOSIS — D0439 Carcinoma in situ of skin of other parts of face: Secondary | ICD-10-CM | POA: Diagnosis not present

## 2021-07-26 DIAGNOSIS — Z08 Encounter for follow-up examination after completed treatment for malignant neoplasm: Secondary | ICD-10-CM | POA: Diagnosis not present

## 2021-07-26 DIAGNOSIS — L820 Inflamed seborrheic keratosis: Secondary | ICD-10-CM | POA: Diagnosis not present

## 2021-07-26 DIAGNOSIS — Z85828 Personal history of other malignant neoplasm of skin: Secondary | ICD-10-CM | POA: Diagnosis not present

## 2021-07-26 DIAGNOSIS — L255 Unspecified contact dermatitis due to plants, except food: Secondary | ICD-10-CM | POA: Diagnosis not present

## 2021-10-11 IMAGING — CR DG FINGER INDEX 2+V*L*
3 series · 3 of 3 positions shown · non-contrast
Comparison: None.

CLINICAL DATA: Trauma to the left index finger today.

EXAM:
LEFT INDEX FINGER 2+V

[finger ap]
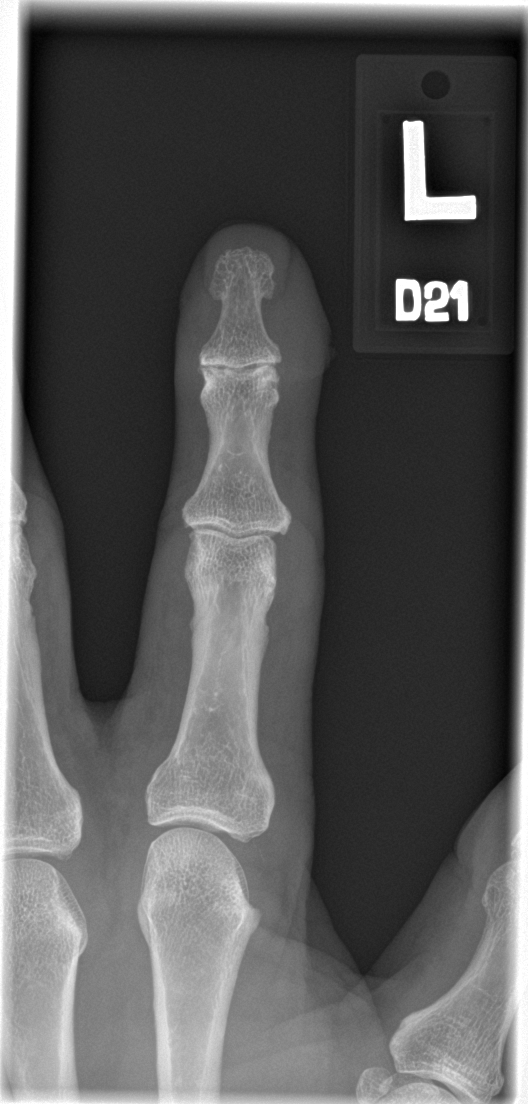

[finger obl]
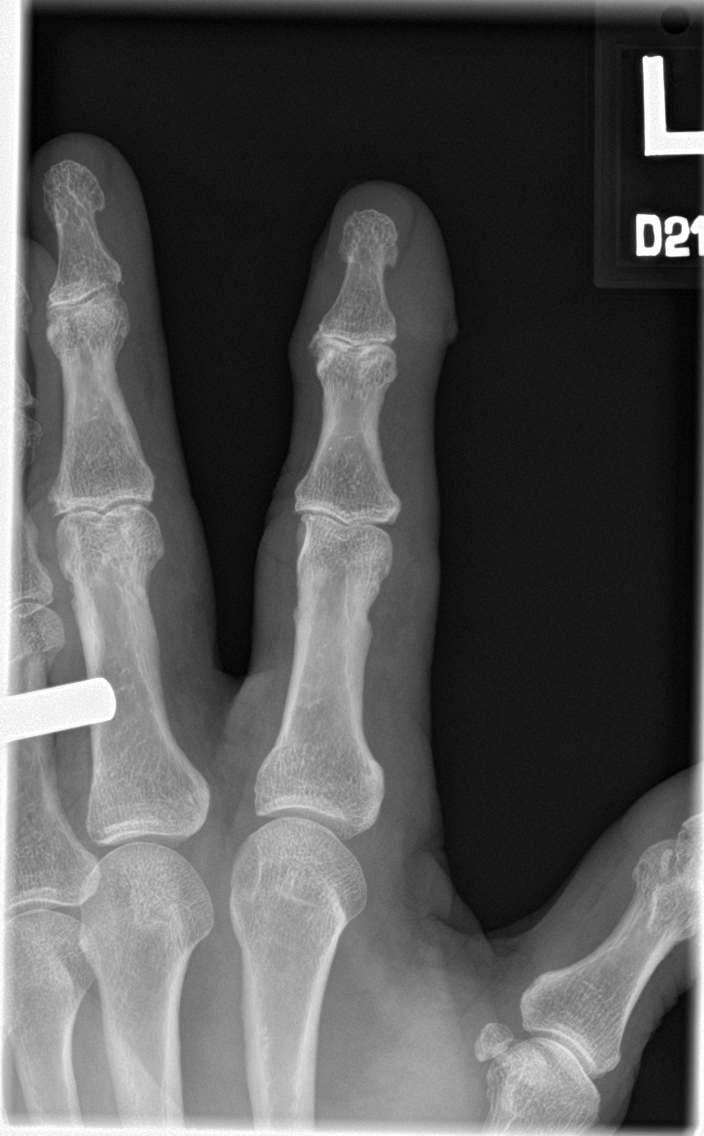

[finger lat]
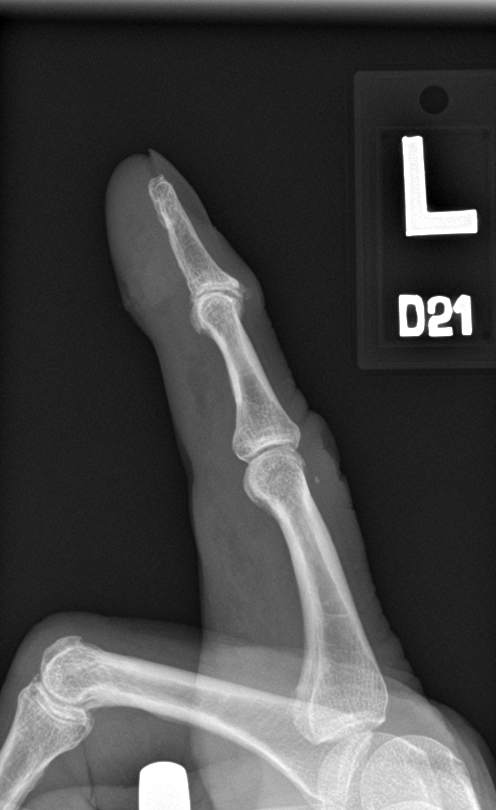

[3 of 3 positions shown; findings below may reference images not displayed]

FINDINGS: There is no evidence of fracture or dislocation. 2 mm radiopaque
density is identified near the distal aspect of the second proximal
phalanx, radiopaque foreign body is not excluded if that is the site
of injury.
IMPRESSION: No acute fracture or dislocation.

2 mm radiopaque density is identified near the distal aspect of the
second proximal phalanx, radiopaque foreign body is not excluded if
that is the site of injury.

## 2021-11-01 DIAGNOSIS — I700 Atherosclerosis of aorta: Secondary | ICD-10-CM | POA: Diagnosis not present

## 2021-11-01 DIAGNOSIS — I10 Essential (primary) hypertension: Secondary | ICD-10-CM | POA: Diagnosis not present

## 2021-11-01 DIAGNOSIS — E1121 Type 2 diabetes mellitus with diabetic nephropathy: Secondary | ICD-10-CM | POA: Diagnosis not present

## 2021-11-01 DIAGNOSIS — R809 Proteinuria, unspecified: Secondary | ICD-10-CM | POA: Diagnosis not present

## 2021-11-01 DIAGNOSIS — N1831 Chronic kidney disease, stage 3a: Secondary | ICD-10-CM | POA: Diagnosis not present

## 2021-11-01 DIAGNOSIS — E782 Mixed hyperlipidemia: Secondary | ICD-10-CM | POA: Diagnosis not present

## 2021-11-08 DIAGNOSIS — N390 Urinary tract infection, site not specified: Secondary | ICD-10-CM | POA: Diagnosis not present

## 2021-11-08 DIAGNOSIS — N400 Enlarged prostate without lower urinary tract symptoms: Secondary | ICD-10-CM | POA: Diagnosis not present

## 2021-11-08 DIAGNOSIS — E782 Mixed hyperlipidemia: Secondary | ICD-10-CM | POA: Diagnosis not present

## 2021-11-08 DIAGNOSIS — Z7184 Encounter for health counseling related to travel: Secondary | ICD-10-CM | POA: Diagnosis not present

## 2021-11-08 DIAGNOSIS — I10 Essential (primary) hypertension: Secondary | ICD-10-CM | POA: Diagnosis not present

## 2021-11-08 DIAGNOSIS — N1831 Chronic kidney disease, stage 3a: Secondary | ICD-10-CM | POA: Diagnosis not present

## 2021-11-08 DIAGNOSIS — R809 Proteinuria, unspecified: Secondary | ICD-10-CM | POA: Diagnosis not present

## 2021-11-08 DIAGNOSIS — Z23 Encounter for immunization: Secondary | ICD-10-CM | POA: Diagnosis not present

## 2021-11-08 DIAGNOSIS — I700 Atherosclerosis of aorta: Secondary | ICD-10-CM | POA: Diagnosis not present

## 2021-11-08 DIAGNOSIS — E1121 Type 2 diabetes mellitus with diabetic nephropathy: Secondary | ICD-10-CM | POA: Diagnosis not present

## 2021-11-11 DIAGNOSIS — Z23 Encounter for immunization: Secondary | ICD-10-CM | POA: Diagnosis not present

## 2022-01-17 DIAGNOSIS — L820 Inflamed seborrheic keratosis: Secondary | ICD-10-CM | POA: Diagnosis not present

## 2022-01-17 DIAGNOSIS — D225 Melanocytic nevi of trunk: Secondary | ICD-10-CM | POA: Diagnosis not present

## 2022-01-17 DIAGNOSIS — X32XXXD Exposure to sunlight, subsequent encounter: Secondary | ICD-10-CM | POA: Diagnosis not present

## 2022-01-17 DIAGNOSIS — L570 Actinic keratosis: Secondary | ICD-10-CM | POA: Diagnosis not present

## 2022-01-17 DIAGNOSIS — L821 Other seborrheic keratosis: Secondary | ICD-10-CM | POA: Diagnosis not present

## 2022-03-10 DIAGNOSIS — Z961 Presence of intraocular lens: Secondary | ICD-10-CM | POA: Diagnosis not present

## 2022-03-10 DIAGNOSIS — E119 Type 2 diabetes mellitus without complications: Secondary | ICD-10-CM | POA: Diagnosis not present

## 2022-04-25 DIAGNOSIS — N390 Urinary tract infection, site not specified: Secondary | ICD-10-CM | POA: Diagnosis not present

## 2022-04-25 DIAGNOSIS — N400 Enlarged prostate without lower urinary tract symptoms: Secondary | ICD-10-CM | POA: Diagnosis not present

## 2022-04-25 DIAGNOSIS — R5383 Other fatigue: Secondary | ICD-10-CM | POA: Diagnosis not present

## 2022-04-25 DIAGNOSIS — E1121 Type 2 diabetes mellitus with diabetic nephropathy: Secondary | ICD-10-CM | POA: Diagnosis not present

## 2022-04-25 DIAGNOSIS — N1831 Chronic kidney disease, stage 3a: Secondary | ICD-10-CM | POA: Diagnosis not present

## 2022-04-25 DIAGNOSIS — I700 Atherosclerosis of aorta: Secondary | ICD-10-CM | POA: Diagnosis not present

## 2022-04-25 DIAGNOSIS — I10 Essential (primary) hypertension: Secondary | ICD-10-CM | POA: Diagnosis not present

## 2022-04-25 DIAGNOSIS — R809 Proteinuria, unspecified: Secondary | ICD-10-CM | POA: Diagnosis not present

## 2022-04-25 DIAGNOSIS — E782 Mixed hyperlipidemia: Secondary | ICD-10-CM | POA: Diagnosis not present

## 2022-05-02 DIAGNOSIS — E1121 Type 2 diabetes mellitus with diabetic nephropathy: Secondary | ICD-10-CM | POA: Diagnosis not present

## 2022-05-02 DIAGNOSIS — R809 Proteinuria, unspecified: Secondary | ICD-10-CM | POA: Diagnosis not present

## 2022-05-02 DIAGNOSIS — E782 Mixed hyperlipidemia: Secondary | ICD-10-CM | POA: Diagnosis not present

## 2022-05-02 DIAGNOSIS — I10 Essential (primary) hypertension: Secondary | ICD-10-CM | POA: Diagnosis not present

## 2022-05-02 DIAGNOSIS — Z0000 Encounter for general adult medical examination without abnormal findings: Secondary | ICD-10-CM | POA: Diagnosis not present

## 2022-05-02 DIAGNOSIS — I700 Atherosclerosis of aorta: Secondary | ICD-10-CM | POA: Diagnosis not present

## 2022-05-02 DIAGNOSIS — N1831 Chronic kidney disease, stage 3a: Secondary | ICD-10-CM | POA: Diagnosis not present

## 2022-05-02 DIAGNOSIS — N200 Calculus of kidney: Secondary | ICD-10-CM | POA: Diagnosis not present

## 2022-05-02 DIAGNOSIS — N400 Enlarged prostate without lower urinary tract symptoms: Secondary | ICD-10-CM | POA: Diagnosis not present

## 2022-07-26 DIAGNOSIS — X32XXXD Exposure to sunlight, subsequent encounter: Secondary | ICD-10-CM | POA: Diagnosis not present

## 2022-07-26 DIAGNOSIS — L570 Actinic keratosis: Secondary | ICD-10-CM | POA: Diagnosis not present

## 2022-08-24 DIAGNOSIS — R3121 Asymptomatic microscopic hematuria: Secondary | ICD-10-CM | POA: Diagnosis not present

## 2022-08-24 DIAGNOSIS — R351 Nocturia: Secondary | ICD-10-CM | POA: Diagnosis not present

## 2022-08-24 DIAGNOSIS — N200 Calculus of kidney: Secondary | ICD-10-CM | POA: Diagnosis not present

## 2022-08-24 DIAGNOSIS — N401 Enlarged prostate with lower urinary tract symptoms: Secondary | ICD-10-CM | POA: Diagnosis not present

## 2022-09-08 DIAGNOSIS — N2889 Other specified disorders of kidney and ureter: Secondary | ICD-10-CM | POA: Diagnosis not present

## 2022-09-08 DIAGNOSIS — N202 Calculus of kidney with calculus of ureter: Secondary | ICD-10-CM | POA: Diagnosis not present

## 2022-09-08 DIAGNOSIS — K8020 Calculus of gallbladder without cholecystitis without obstruction: Secondary | ICD-10-CM | POA: Diagnosis not present

## 2022-09-08 DIAGNOSIS — R3129 Other microscopic hematuria: Secondary | ICD-10-CM | POA: Diagnosis not present

## 2022-09-08 DIAGNOSIS — R3121 Asymptomatic microscopic hematuria: Secondary | ICD-10-CM | POA: Diagnosis not present

## 2022-09-27 DIAGNOSIS — N201 Calculus of ureter: Secondary | ICD-10-CM | POA: Diagnosis not present

## 2022-10-12 ENCOUNTER — Encounter (HOSPITAL_COMMUNITY): Payer: Self-pay

## 2022-10-12 ENCOUNTER — Other Ambulatory Visit: Payer: Self-pay | Admitting: Urology

## 2022-10-12 DIAGNOSIS — N201 Calculus of ureter: Secondary | ICD-10-CM | POA: Diagnosis not present

## 2022-10-12 DIAGNOSIS — N202 Calculus of kidney with calculus of ureter: Secondary | ICD-10-CM | POA: Diagnosis not present

## 2022-10-12 DIAGNOSIS — I7102 Dissection of abdominal aorta: Secondary | ICD-10-CM | POA: Diagnosis not present

## 2022-10-12 DIAGNOSIS — K8020 Calculus of gallbladder without cholecystitis without obstruction: Secondary | ICD-10-CM | POA: Diagnosis not present

## 2022-10-12 DIAGNOSIS — N134 Hydroureter: Secondary | ICD-10-CM | POA: Diagnosis not present

## 2022-10-12 NOTE — H&P (Signed)
Office Visit Report     10/12/2022   --------------------------------------------------------------------------------   Ian [name]  MRN: [id number]  DOB: 31-Oct-1940, 82 year old Male  SSN: [id number]   PRIMARY CARE:  Juline Patch, MD  PRIMARY CARE FAX:  [phone]  REFERRING:  Bartholomew Crews, NP  PROVIDER:  Heloise Purpura, M.D.  LOCATION:  [facility] [phone]     --------------------------------------------------------------------------------   CC/HPI: Left ureteral calculus   For full details, please see my note from 08/24/2022. Ian [name] had a repeat stone study CT scan for his 3 mm left distal ureteral stone. He is getting ready to go out of the country to [place] on Sunday. He has been completely asymptomatic and denies hematuria or flank pain.     ALLERGIES: Lipitor TABS    MEDICATIONS: Crestor 40 mg tablet  Finasteride 5 mg tablet 1 tablet PO Daily  Hydrocodone-Acetaminophen 5 mg-325 mg tablet 1-2 tablet PO Q 6 H prn  Metformin Hcl 500 mg tablet  Amlodipine Besylate 5 mg tablet  Aspirin Ec 81 mg tablet, delayed release  Chromium Picolinate 400 mcg tablet 2 tablet PO Daily  Coq10  Coreg 6.25 mg tablet  Fish Oil     GU PSH: Cysto Uretero Lithotripsy - 2016 Cystoscopy - 2021 Cystoscopy Insert Stent - 2016 ESWL, Right - 2021, 2016, 2008 Locm 300-399Mg /Ml Iodine,1Ml - 09/08/2022, 2021       PSH Notes: Lithotripsy, Cystoscopy With Ureteroscopy With Lithotripsy, Cystoscopy With Insertion Of Ureteral Stent Left, Lithotripsy   NON-GU PSH: No Non-GU PSH    GU PMH: Ureteral calculus (Stable) - 09/27/2022, - 2021, - 2021, Calculus of left ureter, - 2016 Microscopic hematuria - 09/08/2022, (Stable), - 08/24/2022, - 2021, - 2021, - 2021 BPH w/LUTS - 08/24/2022, - 04/16/2021, - 2022, - 2021, Benign prostatic hyperplasia (BPH) with straining on urination, - 2016 Nocturia - 08/24/2022, - 04/16/2021, - 2022, - 2021, - 2017 Renal calculus - 08/24/2022, -  04/16/2021, - 2022 (Stable), - 2018, Nephrolithiasis, - 2016 Acute Cystitis/UTI - 04/16/2021 Encounter for Prostate Cancer screening - 2021, (Stable), - 2019 ED due to arterial insufficiency, Erectile dysfunction due to arterial insufficiency - 2014      PMH Notes:   1) BPH/LUTS: He has been treated successfully with combination medical therapy. He did try to discontinue his alpha blocker in 2009 but found his symptoms were significantly worse without this medication.   Current treatment: Flomax, Finasteride   2) Prostate cancer screening:  Baseline PSA: 0.77 (Jul 2016) -- on finasteride   3) Erectile dysfunction:   Current treatment: Cialis 20 mg prn   4) Questionable testosterone deficiency: He developed symptoms in 2009 including increased fatigue and loss of muscle mass. His libido was preserved. His testosterone at that time was normal at 368 ng/dl.   5) Urolithiasis: He had a prior history of stone disease but initially presented to me in January 2016 with large burden left ureteral calculi and bilateral renal calculi.   Jan 2016: L ureteroscopic laser lithotripsy for two large left ureteral calculi  Mar 2016: L ESWL of left renal calculus  Nov 2021: R ESWL of left ureteral calculus (1.3 cm) (calcium oxalate dihydrate)   6) Hematuria: He developed gross hematuria in November 2021 and was diagnosed with a large right ureteral stone.   Nov 2021: Cystoscopy - negative, CT imaging - 1.3 cm right ureteral stone   NON-GU PMH: Diabetes Type 2 Hypercholesterolemia Hypertension    FAMILY HISTORY: No pertinent family  history - Other   SOCIAL HISTORY: Marital Status: Married Preferred Language: English; Ethnicity: Not Hispanic Or Latino; Race: White     Notes: Never A Smoker, Tobacco Use, Occupation:, Alcohol Use, Marital History - Currently Married   REVIEW OF SYSTEMS:    GU Review Male:   Patient denies get up at night to urinate, stream starts and stops, leakage of urine, have  to strain to urinate , frequent urination, burning/ pain with urination, hard to postpone urination, and trouble starting your streams.  Gastrointestinal (Upper):   Patient denies nausea and vomiting.  Gastrointestinal (Lower):   Patient denies diarrhea and constipation.  Constitutional:   Patient denies fever, night sweats, weight loss, and fatigue.  Skin:   Patient denies skin rash/ lesion and itching.  Eyes:   Patient denies blurred vision and double vision.  Ears/ Nose/ Throat:   Patient denies sore throat and sinus problems.  Hematologic/Lymphatic:   Patient denies swollen glands and easy bruising.  Cardiovascular:   Patient denies leg swelling and chest pains.  Respiratory:   Patient denies cough and shortness of breath.  Endocrine:   Patient denies excessive thirst.  Musculoskeletal:   Patient denies back pain and joint pain.  Neurological:   Patient denies headaches and dizziness.  Psychologic:   Patient denies depression and anxiety.   VITAL SIGNS: None   MULTI-SYSTEM PHYSICAL EXAMINATION:    Constitutional: Well-nourished. No physical deformities. Normally developed. Good grooming.  Respiratory: No labored breathing, no use of accessory muscles.   Cardiovascular: Normal temperature, normal extremity pulses, no swelling, no varicosities.  Gastrointestinal: No CVA tenderness.     Complexity of Data:  X-Ray Review: C.T. Abdomen/Pelvis: Reviewed Films.     08/17/16 08/19/15 08/02/14 07/18/13 06/18/12 06/17/11 04/30/10 04/01/09  PSA  Total PSA 0.38 ng/mL 0.34  0.77  0.68  0.60  0.70  0.61  1.35     03/05/07  Hormones  Testosterone, Total 3.68    Notes:                     I independently reviewed his CT stone study. This demonstrates a persistent 3 mm distal ureteral stone without hydronephrosis.   PROCEDURES:         C.T. Urogram - [id number]      Patient confirmed No Neulasta OnPro Device.   ASSESSMENT:      ICD-10 Details  1 GU:   Ureteral calculus - N20.1    PLAN:            Schedule Return Visit/Planned Activity: Other See Visit Notes             Note: Will schedule surgery          Document Letter(s):  Created for Patient: Clinical Summary   Created for Patient: Clinical Summary         Notes:   1. Left ureteral calculus: We discussed the fact that the stent remains persistent. I am concerned with him going out of the country and he was in agreement to proceed with definitive therapy. I am going to see if I can get him added on to the schedule tomorrow for cystoscopy, left retrograde pyelography, left ureteroscopic laser lithotripsy/stone removal, and possible left ureteral stent placement. We reviewed the potential risks and complications and the expected recovery process. I do think he will be able to go on his trip if he has his stone removed tomorrow.   CC: Bartholomew Crews, NP  Dr. Juline Patch  Next Appointment:      Next Appointment: 11/29/2022 12:15 PM    Appointment Type: Renal Ultrasound    Location: [facility] [phone]    Provider: Radiology Rm1 Radiology Rm 1    Reason for Visit: LEFT RENAL US AND UA

## 2022-10-12 NOTE — Patient Instructions (Signed)
SURGICAL WAITING ROOM VISITATION  Patients having surgery or a procedure may have no more than 2 support people in the waiting area - these visitors may rotate.    Children under the age of 40 must have an adult with them who is not the patient.  Due to an increase in RSV and influenza rates and associated hospitalizations, children ages 9 and under may not visit patients in [facility] hospitals.  If the patient needs to stay at the hospital during part of their recovery, the visitor guidelines for inpatient rooms apply. Pre-op nurse will coordinate an appropriate time for 1 support person to accompany patient in pre-op.  This support person may not rotate.    Please refer to the [facility] website for the visitor guidelines for Inpatients (after your surgery is over and you are in a regular room).       Your procedure is scheduled on: Thursday, Sept 12, 2024   Report to [facility] Main Entrance    Report to admitting at 2:15 PM   Call this number if you have problems the morning of surgery [phone]   Do not eat food or drink :After Midnight.  FOLLOW BOWEL PREP AND ANY ADDITIONAL PRE OP INSTRUCTIONS YOU RECEIVED FROM YOUR SURGEON'S OFFICE!!!     Oral Hygiene is also important to reduce your risk of infection.                                    Remember - BRUSH YOUR TEETH THE MORNING OF SURGERY WITH YOUR REGULAR TOOTHPASTE  DENTURES WILL BE REMOVED PRIOR TO SURGERY PLEASE DO NOT APPLY "Poly grip" OR ADHESIVES!!!   Do NOT smoke after Midnight   Stop all vitamins and herbal supplements 7 days before surgery.   Take these medicines the morning of surgery with A SIP OF WATER:  Amlodipine Carvedilol Finasteride Rosuvastatin  DO NOT TAKE ANY ORAL DIABETIC MEDICATIONS DAY OF YOUR SURGERY  Bring CPAP mask and tubing day of surgery.                              You may not have any metal on your body including hair pins, jewelry, and body piercing             Do  not wear lotions, powders, perfumes/cologne, or deodorant              Men may shave face and neck.   Do not bring valuables to the hospital. [facility] IS NOT             RESPONSIBLE   FOR VALUABLES.   Contacts, glasses, dentures or bridgework may not be worn into surgery.   Bring small overnight bag day of surgery.   DO NOT BRING YOUR HOME MEDICATIONS TO THE HOSPITAL. PHARMACY WILL DISPENSE MEDICATIONS LISTED ON YOUR MEDICATION LIST TO YOU DURING YOUR ADMISSION IN THE HOSPITAL!    Patients discharged on the day of surgery will not be allowed to drive home.  Someone NEEDS to stay with you for the first 24 hours after anesthesia.   Special Instructions: Bring a copy of your healthcare power of attorney and living will documents the day of surgery if you haven't scanned them before.              Please read over the following fact sheets  you were given: IF YOU HAVE QUESTIONS ABOUT YOUR PRE-OP INSTRUCTIONS PLEASE CALL [phone]   If you received a COVID test during your pre-op visit  it is requested that you wear a mask when out in public, stay away from anyone that may not be feeling well and notify your surgeon if you develop symptoms. If you test positive for Covid or have been in contact with anyone that has tested positive in the last 10 days please notify you surgeon.

## 2022-10-12 NOTE — Anesthesia Preprocedure Evaluation (Signed)
Anesthesia Evaluation  Patient identified by MRN, date of birth, ID band Patient awake    Reviewed: Allergy & Precautions, NPO status , Patient's Chart, lab work & pertinent test results  History of Anesthesia Complications Negative for: history of anesthetic complications  Airway Mallampati: III  TM Distance: >3 FB Neck ROM: Full    Dental  (+) Dental Advisory Given,    Pulmonary neg pulmonary ROS   Pulmonary exam normal breath sounds clear to auscultation       Cardiovascular hypertension (amlodipine, carvedilol), Pt. on medications and Pt. on home beta blockers (-) angina (-) Past MI, (-) Cardiac Stents and (-) CABG (-) dysrhythmias  Rhythm:Regular Rate:Normal  HLD   Neuro/Psych negative neurological ROS     GI/Hepatic Neg liver ROS,GERD  ,,  Endo/Other  diabetes, Type 2, Oral Hypoglycemic Agents    Renal/GU Renal disease (stones)     Musculoskeletal   Abdominal   Peds  Hematology negative hematology ROS (+)   Anesthesia Other Findings   Reproductive/Obstetrics                             Anesthesia Physical Anesthesia Plan  ASA: 3  Anesthesia Plan: General   Post-op Pain Management: Tylenol PO (pre-op)*   Induction: Intravenous  PONV Risk Score and Plan: 2 and Ondansetron, Dexamethasone and Treatment may vary due to age or medical condition  Airway Management Planned: LMA  Additional Equipment:   Intra-op Plan:   Post-operative Plan: Extubation in OR  Informed Consent: I have reviewed the patients History and Physical, chart, labs and discussed the procedure including the risks, benefits and alternatives for the proposed anesthesia with the patient or authorized representative who has indicated his/her understanding and acceptance.     Dental advisory given  Plan Discussed with: CRNA and Anesthesiologist  Anesthesia Plan Comments: (Risks of general anesthesia  discussed including, but not limited to, sore throat, hoarse voice, chipped/damaged teeth, injury to vocal cords, nausea and vomiting, allergic reactions, lung infection, heart attack, stroke, and death. All questions answered. )       Anesthesia Quick Evaluation

## 2022-10-12 NOTE — Progress Notes (Signed)
Attempted twice to obtain medical history via telephone, unable to reach at this time. HIPAA compliant voicemail message left requesting return call to pre surgical testing department.

## 2022-10-13 ENCOUNTER — Ambulatory Visit (HOSPITAL_COMMUNITY): Payer: Medicare Other | Admitting: Anesthesiology

## 2022-10-13 ENCOUNTER — Encounter (HOSPITAL_COMMUNITY)
Admission: RE | Disposition: A | Discharge status: Home / Self Care | Payer: Self-pay | Source: Home / Self Care | Attending: Urology

## 2022-10-13 ENCOUNTER — Ambulatory Visit (HOSPITAL_COMMUNITY)
Admission: RE | Admit: 2022-10-13 | Discharge: 2022-10-13 | Disposition: A | Discharge status: Home / Self Care | Payer: Medicare Other | Attending: Urology | Admitting: Urology

## 2022-10-13 ENCOUNTER — Ambulatory Visit (HOSPITAL_BASED_OUTPATIENT_CLINIC_OR_DEPARTMENT_OTHER): Payer: Self-pay | Admitting: Anesthesiology

## 2022-10-13 ENCOUNTER — Ambulatory Visit (HOSPITAL_COMMUNITY): Payer: Medicare Other

## 2022-10-13 ENCOUNTER — Encounter (HOSPITAL_COMMUNITY): Payer: Self-pay | Admitting: Urology

## 2022-10-13 DIAGNOSIS — N201 Calculus of ureter: Secondary | ICD-10-CM

## 2022-10-13 DIAGNOSIS — Z7984 Long term (current) use of oral hypoglycemic drugs: Secondary | ICD-10-CM | POA: Diagnosis not present

## 2022-10-13 DIAGNOSIS — E785 Hyperlipidemia, unspecified: Secondary | ICD-10-CM | POA: Diagnosis not present

## 2022-10-13 DIAGNOSIS — Z79899 Other long term (current) drug therapy: Secondary | ICD-10-CM | POA: Insufficient documentation

## 2022-10-13 DIAGNOSIS — E119 Type 2 diabetes mellitus without complications: Secondary | ICD-10-CM | POA: Insufficient documentation

## 2022-10-13 DIAGNOSIS — I10 Essential (primary) hypertension: Secondary | ICD-10-CM | POA: Insufficient documentation

## 2022-10-13 DIAGNOSIS — N202 Calculus of kidney with calculus of ureter: Secondary | ICD-10-CM | POA: Diagnosis not present

## 2022-10-13 DIAGNOSIS — E11649 Type 2 diabetes mellitus with hypoglycemia without coma: Secondary | ICD-10-CM | POA: Diagnosis not present

## 2022-10-13 HISTORY — PX: CYSTOSCOPY/URETEROSCOPY/HOLMIUM LASER/STENT PLACEMENT: SHX6546

## 2022-10-13 LAB — BASIC METABOLIC PANEL
Anion gap: 9 (ref 5–15)
BUN: 24 mg/dL — ABNORMAL HIGH (ref 8–23)
CO2: 24 mmol/L (ref 22–32)
Calcium: 9.4 mg/dL (ref 8.9–10.3)
Chloride: 106 mmol/L (ref 98–111)
Creatinine, Ser: 1.13 mg/dL (ref 0.61–1.24)
GFR, Estimated: >60 mL/min (ref >60)
Glucose, Bld: 125 mg/dL — ABNORMAL HIGH (ref 70–99)
Potassium: 3.7 mmol/L (ref 3.5–5.1)
Sodium: 139 mmol/L (ref 135–145)

## 2022-10-13 LAB — GLUCOSE, CAPILLARY
Glucose-Capillary: 115 mg/dL — ABNORMAL HIGH (ref 70–99)
Glucose-Capillary: 118 mg/dL — ABNORMAL HIGH (ref 70–99)

## 2022-10-13 SURGERY — CYSTOSCOPY/URETEROSCOPY/HOLMIUM LASER/STENT PLACEMENT
Anesthesia: General | Laterality: Left

## 2022-10-13 MED ORDER — AMISULPRIDE (ANTIEMETIC) 5 MG/2ML IV SOLN: 10.0000 mg | Freq: Once | INTRAVENOUS | Status: DC | PRN

## 2022-10-13 MED ORDER — CEFAZOLIN SODIUM-DEXTROSE 2-4 GM/100ML-% IV SOLN
2.0000 g | INTRAVENOUS | Status: AC
  Administered 2022-10-13: 2 g via INTRAVENOUS
  Filled 2022-10-13: qty 100

## 2022-10-13 MED ORDER — CHLORHEXIDINE GLUCONATE 0.12 % MT SOLN
15.0000 mL | Freq: Once | OROMUCOSAL | Status: AC
  Administered 2022-10-13: 15 mL via OROMUCOSAL

## 2022-10-13 MED ORDER — ACETAMINOPHEN 500 MG PO TABS
1000.0000 mg | ORAL_TABLET | Freq: Once | ORAL | Status: AC
  Administered 2022-10-13: 1000 mg via ORAL
  Filled 2022-10-13: qty 2

## 2022-10-13 MED ORDER — LIDOCAINE HCL (PF) 2 % IJ SOLN
INTRAMUSCULAR | Status: AC
  Filled 2022-10-13: qty 5

## 2022-10-13 MED ORDER — PROPOFOL 10 MG/ML IV BOLUS
INTRAVENOUS | Status: DC | PRN
  Administered 2022-10-13: 90 mg via INTRAVENOUS

## 2022-10-13 MED ORDER — LACTATED RINGERS IV SOLN: INTRAVENOUS | Status: DC

## 2022-10-13 MED ORDER — LIDOCAINE HCL (CARDIAC) PF 100 MG/5ML IV SOSY
PREFILLED_SYRINGE | INTRAVENOUS | Status: DC | PRN
  Administered 2022-10-13: 60 mg via INTRAVENOUS

## 2022-10-13 MED ORDER — OXYCODONE HCL 5 MG PO TABS: 5.0000 mg | ORAL_TABLET | Freq: Once | ORAL | Status: DC | PRN

## 2022-10-13 MED ORDER — DEXAMETHASONE SODIUM PHOSPHATE 10 MG/ML IJ SOLN
INTRAMUSCULAR | Status: AC
  Filled 2022-10-13: qty 1

## 2022-10-13 MED ORDER — 0.9 % SODIUM CHLORIDE (POUR BTL) OPTIME
TOPICAL | Status: DC | PRN
Start: 2022-10-13 — End: 2022-10-13
  Administered 2022-10-13: 1000 mL

## 2022-10-13 MED ORDER — ONDANSETRON HCL 4 MG/2ML IJ SOLN
INTRAMUSCULAR | Status: AC
  Filled 2022-10-13: qty 2

## 2022-10-13 MED ORDER — PROPOFOL 10 MG/ML IV BOLUS
INTRAVENOUS | Status: AC
  Filled 2022-10-13: qty 20

## 2022-10-13 MED ORDER — ORAL CARE MOUTH RINSE: 15.0000 mL | Freq: Once | OROMUCOSAL | Status: AC

## 2022-10-13 MED ORDER — OXYCODONE HCL 5 MG/5ML PO SOLN: 5.0000 mg | Freq: Once | ORAL | Status: DC | PRN

## 2022-10-13 MED ORDER — FENTANYL CITRATE (PF) 100 MCG/2ML IJ SOLN
INTRAMUSCULAR | Status: AC
  Filled 2022-10-13: qty 2

## 2022-10-13 MED ORDER — IOHEXOL 300 MG/ML  SOLN
INTRAMUSCULAR | Status: DC | PRN
  Administered 2022-10-13: 2 mL

## 2022-10-13 MED ORDER — ONDANSETRON HCL 4 MG/2ML IJ SOLN
INTRAMUSCULAR | Status: DC | PRN
  Administered 2022-10-13: 4 mg via INTRAVENOUS

## 2022-10-13 MED ORDER — DEXAMETHASONE SODIUM PHOSPHATE 4 MG/ML IJ SOLN
INTRAMUSCULAR | Status: DC | PRN
  Administered 2022-10-13: 4 mg via INTRAVENOUS

## 2022-10-13 MED ORDER — FENTANYL CITRATE PF 50 MCG/ML IJ SOSY: 25.0000 ug | PREFILLED_SYRINGE | INTRAMUSCULAR | Status: DC | PRN

## 2022-10-13 MED ORDER — SODIUM CHLORIDE 0.9 % IR SOLN
Status: DC | PRN
  Administered 2022-10-13: 3000 mL via INTRAVESICAL

## 2022-10-13 MED ORDER — FENTANYL CITRATE (PF) 100 MCG/2ML IJ SOLN
INTRAMUSCULAR | Status: DC | PRN
  Administered 2022-10-13: 50 ug via INTRAVENOUS

## 2022-10-13 SURGICAL SUPPLY — 23 items
BAG COUNTER SPONGE SURGICOUNT (BAG) IMPLANT
BAG SPNG CNTER NS LX DISP (BAG)
BAG URO CATCHER STRL LF (MISCELLANEOUS) ×1 IMPLANT
BASKET ZERO TIP NITINOL 2.4FR (BASKET) IMPLANT
BSKT STON RTRVL ZERO TP 2.4FR (BASKET) ×1
CATH URETL OPEN END 6FR 70 (CATHETERS) IMPLANT
CLOTH BEACON ORANGE TIMEOUT ST (SAFETY) ×1 IMPLANT
GLOVE SURG LX STRL 7.5 STRW (GLOVE) ×1 IMPLANT
GOWN STRL REUS W/ TWL XL LVL3 (GOWN DISPOSABLE) ×1 IMPLANT
GOWN STRL REUS W/TWL XL LVL3 (GOWN DISPOSABLE) ×1
GUIDEWIRE STR DUAL SENSOR (WIRE) ×1 IMPLANT
GUIDEWIRE ZIPWRE .038 STRAIGHT (WIRE) IMPLANT
IV NS 1000ML (IV SOLUTION) ×1
IV NS 1000ML BAXH (IV SOLUTION) ×1 IMPLANT
KIT TURNOVER KIT A (KITS) IMPLANT
LASER FIB FLEXIVA PULSE ID 365 (Laser) IMPLANT
MANIFOLD NEPTUNE II (INSTRUMENTS) ×1 IMPLANT
PACK CYSTO (CUSTOM PROCEDURE TRAY) ×1 IMPLANT
SHEATH NAVIGATOR HD 12/14X36 (SHEATH) IMPLANT
TRACTIP FLEXIVA PULS ID 200XHI (Laser) IMPLANT
TRACTIP FLEXIVA PULSE ID 200 (Laser)
TUBING CONNECTING 10 (TUBING) ×1 IMPLANT
TUBING UROLOGY SET (TUBING) ×1 IMPLANT

## 2022-10-13 NOTE — Discharge Instructions (Signed)
You may see some blood in the urine and may have some burning with urination for 48-72 hours. You also may notice that you have to urinate more frequently or urgently after your procedure which is normal.  You should call should you develop an inability urinate, fever > 101, persistent nausea and vomiting that prevents you from eating or drinking to stay hydrated.    

## 2022-10-13 NOTE — Anesthesia Postprocedure Evaluation (Signed)
Anesthesia Post Note  Patient: Ian [name]  Procedure(s) Performed: CYSTOSCOPY/LEFT RETROGRADE PYELOGRAM/LEFT URETEROSCOPY WITH STONE REMOVAL (Left)     Patient location during evaluation: PACU Anesthesia Type: General Level of consciousness: awake Pain management: pain level controlled Vital Signs Assessment: post-procedure vital signs reviewed and stable Respiratory status: spontaneous breathing, nonlabored ventilation and respiratory function stable Cardiovascular status: blood pressure returned to baseline and stable Postop Assessment: no apparent nausea or vomiting Anesthetic complications: no   No notable events documented.  Last Vitals:  Vitals:   10/13/22 1606 10/13/22 1615  BP:  (!) 148/68  Pulse: (!) 57 62  Resp: 13 10  Temp:    SpO2: 97% 97%    Last Pain:  Vitals:   10/13/22 1615  TempSrc:   PainSc: 0-No pain                 Linton Rump

## 2022-10-13 NOTE — Anesthesia Procedure Notes (Signed)
Procedure Name: LMA Insertion Date/Time: 10/13/2022 3:21 PM  Performed by: Johnette Abraham, CRNAPre-anesthesia Checklist: Patient identified, Emergency Drugs available, Suction available and Patient being monitored Patient Re-evaluated:Patient Re-evaluated prior to induction Oxygen Delivery Method: Circle System Utilized Preoxygenation: Pre-oxygenation with 100% oxygen Induction Type: IV induction Ventilation: Mask ventilation without difficulty LMA: LMA inserted LMA Size: 4.0 Number of attempts: 1 Airway Equipment and Method: Bite block Placement Confirmation: positive ETCO2 Tube secured with: Tape Dental Injury: Teeth and Oropharynx as per pre-operative assessment

## 2022-10-13 NOTE — Op Note (Signed)
Preoperative diagnosis: Left distal ureteral calculus  Postoperative diagnosis: Left distal ureteral calculus  Procedure:  Cystoscopy Left ureteroscopy and stone removal Left retrograde pyelography with interpretation  Surgeon: Moody Bruins. M.D.  Anesthesia: General  Complications: None  Intraoperative findings: Left retrograde pyelography demonstrated a filling defect within the distal lfet ureter consistent with the patient's known calculus without other abnormalities.  EBL: Minimal  Specimens: Left ureteral calculus  Disposition of specimens: [facility] for stone analysis  Indication: Ian [name] is a 82 y.o. year old patient with urolithiasis. He recently presented with hematuria and was found to have a 3 mm distal left ureteral stone.  He was unable to pass this stone and therefore elected definitive treatment prior to his upcoming trip out of the country. After reviewing the management options for treatment, the patient elected to proceed with the above surgical procedure(s). We have discussed the potential benefits and risks of the procedure, side effects of the proposed treatment, the likelihood of the patient achieving the goals of the procedure, and any potential problems that might occur during the procedure or recuperation. Informed consent has been obtained.  Description of procedure:  The patient was taken to the operating room and general anesthesia was induced.  The patient was placed in the dorsal lithotomy position, prepped and draped in the usual sterile fashion, and preoperative antibiotics were administered. A preoperative time-out was performed.   Cystourethroscopy was performed.  The patient's urethra was examined and was normal. The bladder was then systematically examined in its entirety. There was no evidence for any bladder tumors, stones, or other mucosal pathology.    Attention then turned to the left ureteral orifice and a  ureteral catheter was used to intubate the ureteral orifice.  Omnipaque contrast was injected through the ureteral catheter and a retrograde pyelogram was performed with findings as dictated above.  A 0.38 sensor guidewire was then advanced up the left ureter into the renal pelvis under fluoroscopic guidance. The 6 Fr semirigid ureteroscope was then advanced into the ureter next to the guidewire and the calculus was identified.  The stone was then removed from the ureter with a zero tip nitinol basket.  Reinspection of the ureter revealed no remaining visible stones or fragments.   The wire was then backloaded through the cystoscope and a ureteral stent was advance over the wire using Seldinger technique.  The stent was positioned appropriately under fluoroscopic and cystoscopic guidance.  The wire was then removed with an adequate stent curl noted in the renal pelvis as well as in the bladder.  The bladder was then emptied and the procedure ended.  The patient appeared to tolerate the procedure well and without complications.  The patient was able to be awakened and transferred to the recovery unit in satisfactory condition.

## 2022-10-13 NOTE — Interval H&P Note (Signed)
History and Physical Interval Note:  10/13/2022 2:25 PM  Ian [name]  has presented today for surgery, with the diagnosis of LEFT URETERAL CALCULUS.  The various methods of treatment have been discussed with the patient and family. After consideration of risks, benefits and other options for treatment, the patient has consented to  Procedure(s) with comments: CYSTOSCOPY/LEFT RETROGRADE PYELOGRAM/LEFT URETEROSCOPY/HOLMIUM LASER/POSSIBLE STENT PLACEMENT (Left) - 45 MINUTES NEEDED FOR CASE as a surgical intervention.  The patient's history has been reviewed, patient examined, no change in status, stable for surgery.  I have reviewed the patient's chart and labs.  Questions were answered to the patient's satisfaction.     [employer]

## 2022-10-13 NOTE — Transfer of Care (Signed)
Immediate Anesthesia Transfer of Care Note  Patient: Ian [name]  Procedure(s) Performed: CYSTOSCOPY/LEFT RETROGRADE PYELOGRAM/LEFT URETEROSCOPY WITH STONE REMOVAL (Left)  Patient Location: PACU  Anesthesia Type:General  Level of Consciousness: awake, alert , oriented, and patient cooperative  Airway & Oxygen Therapy: Patient Spontanous Breathing and Patient connected to face mask oxygen  Post-op Assessment: Report given to RN, Post -op Vital signs reviewed and stable, and Patient moving all extremities  Post vital signs: Reviewed and stable  Last Vitals:  Vitals Value Taken Time  BP 149/75 10/13/22 1600  Temp    Pulse 57 10/13/22 1603  Resp 14 10/13/22 1603  SpO2 98 % 10/13/22 1603  Vitals shown include unfiled device data.  Last Pain:  Vitals:   10/13/22 1435  TempSrc: Oral  PainSc: 0-No pain      Patients Stated Pain Goal: 4 (10/13/22 1435)  Complications: No notable events documented.

## 2022-10-14 ENCOUNTER — Encounter (HOSPITAL_COMMUNITY): Payer: Self-pay | Admitting: Urology

## 2022-10-14 LAB — HEMOGLOBIN A1C
Hgb A1c MFr Bld: 7 % — ABNORMAL HIGH (ref 4.8–5.6)
Mean Plasma Glucose: 154 mg/dL

## 2022-11-15 DIAGNOSIS — E1121 Type 2 diabetes mellitus with diabetic nephropathy: Secondary | ICD-10-CM | POA: Diagnosis not present

## 2022-11-15 DIAGNOSIS — I10 Essential (primary) hypertension: Secondary | ICD-10-CM | POA: Diagnosis not present

## 2022-11-15 DIAGNOSIS — Z0000 Encounter for general adult medical examination without abnormal findings: Secondary | ICD-10-CM | POA: Diagnosis not present

## 2022-11-15 DIAGNOSIS — N400 Enlarged prostate without lower urinary tract symptoms: Secondary | ICD-10-CM | POA: Diagnosis not present

## 2022-11-15 DIAGNOSIS — E782 Mixed hyperlipidemia: Secondary | ICD-10-CM | POA: Diagnosis not present

## 2022-11-15 DIAGNOSIS — R809 Proteinuria, unspecified: Secondary | ICD-10-CM | POA: Diagnosis not present

## 2022-11-15 DIAGNOSIS — N1831 Chronic kidney disease, stage 3a: Secondary | ICD-10-CM | POA: Diagnosis not present

## 2022-11-15 DIAGNOSIS — I700 Atherosclerosis of aorta: Secondary | ICD-10-CM | POA: Diagnosis not present

## 2022-11-22 DIAGNOSIS — N1831 Chronic kidney disease, stage 3a: Secondary | ICD-10-CM | POA: Diagnosis not present

## 2022-11-22 DIAGNOSIS — E782 Mixed hyperlipidemia: Secondary | ICD-10-CM | POA: Diagnosis not present

## 2022-11-22 DIAGNOSIS — I700 Atherosclerosis of aorta: Secondary | ICD-10-CM | POA: Diagnosis not present

## 2022-11-22 DIAGNOSIS — R809 Proteinuria, unspecified: Secondary | ICD-10-CM | POA: Diagnosis not present

## 2022-11-22 DIAGNOSIS — I10 Essential (primary) hypertension: Secondary | ICD-10-CM | POA: Diagnosis not present

## 2022-11-22 DIAGNOSIS — Z23 Encounter for immunization: Secondary | ICD-10-CM | POA: Diagnosis not present

## 2022-11-22 DIAGNOSIS — E1122 Type 2 diabetes mellitus with diabetic chronic kidney disease: Secondary | ICD-10-CM | POA: Diagnosis not present

## 2022-11-22 DIAGNOSIS — N400 Enlarged prostate without lower urinary tract symptoms: Secondary | ICD-10-CM | POA: Diagnosis not present

## 2022-11-22 DIAGNOSIS — N200 Calculus of kidney: Secondary | ICD-10-CM | POA: Diagnosis not present

## 2022-11-29 DIAGNOSIS — R351 Nocturia: Secondary | ICD-10-CM | POA: Diagnosis not present

## 2022-11-29 DIAGNOSIS — N401 Enlarged prostate with lower urinary tract symptoms: Secondary | ICD-10-CM | POA: Diagnosis not present

## 2022-11-29 DIAGNOSIS — N201 Calculus of ureter: Secondary | ICD-10-CM | POA: Diagnosis not present

## 2023-02-14 DIAGNOSIS — X32XXXD Exposure to sunlight, subsequent encounter: Secondary | ICD-10-CM | POA: Diagnosis not present

## 2023-02-14 DIAGNOSIS — L570 Actinic keratosis: Secondary | ICD-10-CM | POA: Diagnosis not present

## 2023-02-14 DIAGNOSIS — B078 Other viral warts: Secondary | ICD-10-CM | POA: Diagnosis not present

## 2023-03-13 DIAGNOSIS — Z961 Presence of intraocular lens: Secondary | ICD-10-CM | POA: Diagnosis not present

## 2023-03-13 DIAGNOSIS — C44319 Basal cell carcinoma of skin of other parts of face: Secondary | ICD-10-CM | POA: Diagnosis not present

## 2023-03-13 DIAGNOSIS — E119 Type 2 diabetes mellitus without complications: Secondary | ICD-10-CM | POA: Diagnosis not present

## 2023-03-13 DIAGNOSIS — X32XXXD Exposure to sunlight, subsequent encounter: Secondary | ICD-10-CM | POA: Diagnosis not present

## 2023-03-13 DIAGNOSIS — L570 Actinic keratosis: Secondary | ICD-10-CM | POA: Diagnosis not present

## 2023-04-17 DIAGNOSIS — L820 Inflamed seborrheic keratosis: Secondary | ICD-10-CM | POA: Diagnosis not present

## 2023-04-17 DIAGNOSIS — Z85828 Personal history of other malignant neoplasm of skin: Secondary | ICD-10-CM | POA: Diagnosis not present

## 2023-04-17 DIAGNOSIS — Z08 Encounter for follow-up examination after completed treatment for malignant neoplasm: Secondary | ICD-10-CM | POA: Diagnosis not present

## 2023-05-09 DIAGNOSIS — I700 Atherosclerosis of aorta: Secondary | ICD-10-CM | POA: Diagnosis not present

## 2023-05-09 DIAGNOSIS — R5383 Other fatigue: Secondary | ICD-10-CM | POA: Diagnosis not present

## 2023-05-09 DIAGNOSIS — E782 Mixed hyperlipidemia: Secondary | ICD-10-CM | POA: Diagnosis not present

## 2023-05-09 DIAGNOSIS — N400 Enlarged prostate without lower urinary tract symptoms: Secondary | ICD-10-CM | POA: Diagnosis not present

## 2023-05-09 DIAGNOSIS — N1831 Chronic kidney disease, stage 3a: Secondary | ICD-10-CM | POA: Diagnosis not present

## 2023-05-09 DIAGNOSIS — E1122 Type 2 diabetes mellitus with diabetic chronic kidney disease: Secondary | ICD-10-CM | POA: Diagnosis not present

## 2023-05-09 DIAGNOSIS — N200 Calculus of kidney: Secondary | ICD-10-CM | POA: Diagnosis not present

## 2023-05-09 DIAGNOSIS — R809 Proteinuria, unspecified: Secondary | ICD-10-CM | POA: Diagnosis not present

## 2023-05-09 DIAGNOSIS — I10 Essential (primary) hypertension: Secondary | ICD-10-CM | POA: Diagnosis not present

## 2023-05-16 DIAGNOSIS — E782 Mixed hyperlipidemia: Secondary | ICD-10-CM | POA: Diagnosis not present

## 2023-05-16 DIAGNOSIS — Z0000 Encounter for general adult medical examination without abnormal findings: Secondary | ICD-10-CM | POA: Diagnosis not present

## 2023-05-16 DIAGNOSIS — N1831 Chronic kidney disease, stage 3a: Secondary | ICD-10-CM | POA: Diagnosis not present

## 2023-05-16 DIAGNOSIS — N200 Calculus of kidney: Secondary | ICD-10-CM | POA: Diagnosis not present

## 2023-05-16 DIAGNOSIS — E1122 Type 2 diabetes mellitus with diabetic chronic kidney disease: Secondary | ICD-10-CM | POA: Diagnosis not present

## 2023-05-16 DIAGNOSIS — R809 Proteinuria, unspecified: Secondary | ICD-10-CM | POA: Diagnosis not present

## 2023-05-16 DIAGNOSIS — N390 Urinary tract infection, site not specified: Secondary | ICD-10-CM | POA: Diagnosis not present

## 2023-05-16 DIAGNOSIS — I700 Atherosclerosis of aorta: Secondary | ICD-10-CM | POA: Diagnosis not present

## 2023-05-16 DIAGNOSIS — N400 Enlarged prostate without lower urinary tract symptoms: Secondary | ICD-10-CM | POA: Diagnosis not present

## 2023-05-16 DIAGNOSIS — I10 Essential (primary) hypertension: Secondary | ICD-10-CM | POA: Diagnosis not present

## 2023-09-28 DIAGNOSIS — N200 Calculus of kidney: Secondary | ICD-10-CM | POA: Diagnosis not present

## 2023-09-28 DIAGNOSIS — N400 Enlarged prostate without lower urinary tract symptoms: Secondary | ICD-10-CM | POA: Diagnosis not present

## 2023-09-28 DIAGNOSIS — E1122 Type 2 diabetes mellitus with diabetic chronic kidney disease: Secondary | ICD-10-CM | POA: Diagnosis not present

## 2023-09-28 DIAGNOSIS — E782 Mixed hyperlipidemia: Secondary | ICD-10-CM | POA: Diagnosis not present

## 2023-09-28 DIAGNOSIS — I700 Atherosclerosis of aorta: Secondary | ICD-10-CM | POA: Diagnosis not present

## 2023-09-28 DIAGNOSIS — I10 Essential (primary) hypertension: Secondary | ICD-10-CM | POA: Diagnosis not present

## 2023-09-28 DIAGNOSIS — N1831 Chronic kidney disease, stage 3a: Secondary | ICD-10-CM | POA: Diagnosis not present

## 2023-09-28 DIAGNOSIS — R809 Proteinuria, unspecified: Secondary | ICD-10-CM | POA: Diagnosis not present

## 2023-10-10 DIAGNOSIS — R809 Proteinuria, unspecified: Secondary | ICD-10-CM | POA: Diagnosis not present

## 2023-10-10 DIAGNOSIS — Z23 Encounter for immunization: Secondary | ICD-10-CM | POA: Diagnosis not present

## 2023-10-10 DIAGNOSIS — N200 Calculus of kidney: Secondary | ICD-10-CM | POA: Diagnosis not present

## 2023-10-10 DIAGNOSIS — E1122 Type 2 diabetes mellitus with diabetic chronic kidney disease: Secondary | ICD-10-CM | POA: Diagnosis not present

## 2023-10-10 DIAGNOSIS — N1831 Chronic kidney disease, stage 3a: Secondary | ICD-10-CM | POA: Diagnosis not present

## 2023-10-10 DIAGNOSIS — E782 Mixed hyperlipidemia: Secondary | ICD-10-CM | POA: Diagnosis not present

## 2023-10-10 DIAGNOSIS — I10 Essential (primary) hypertension: Secondary | ICD-10-CM | POA: Diagnosis not present

## 2023-10-10 DIAGNOSIS — N400 Enlarged prostate without lower urinary tract symptoms: Secondary | ICD-10-CM | POA: Diagnosis not present

## 2023-10-10 DIAGNOSIS — I700 Atherosclerosis of aorta: Secondary | ICD-10-CM | POA: Diagnosis not present

## 2023-10-12 DIAGNOSIS — Z23 Encounter for immunization: Secondary | ICD-10-CM | POA: Diagnosis not present

## 2023-10-16 DIAGNOSIS — L820 Inflamed seborrheic keratosis: Secondary | ICD-10-CM | POA: Diagnosis not present

## 2023-10-16 DIAGNOSIS — Z85828 Personal history of other malignant neoplasm of skin: Secondary | ICD-10-CM | POA: Diagnosis not present

## 2023-10-16 DIAGNOSIS — Z08 Encounter for follow-up examination after completed treatment for malignant neoplasm: Secondary | ICD-10-CM | POA: Diagnosis not present

## 2023-10-16 DIAGNOSIS — L570 Actinic keratosis: Secondary | ICD-10-CM | POA: Diagnosis not present

## 2023-10-16 DIAGNOSIS — X32XXXD Exposure to sunlight, subsequent encounter: Secondary | ICD-10-CM | POA: Diagnosis not present

## 2023-11-29 DIAGNOSIS — R3912 Poor urinary stream: Secondary | ICD-10-CM | POA: Diagnosis not present

## 2023-11-29 DIAGNOSIS — N400 Enlarged prostate without lower urinary tract symptoms: Secondary | ICD-10-CM | POA: Diagnosis not present

## 2023-11-29 DIAGNOSIS — N200 Calculus of kidney: Secondary | ICD-10-CM | POA: Diagnosis not present

## 2023-11-29 DIAGNOSIS — N401 Enlarged prostate with lower urinary tract symptoms: Secondary | ICD-10-CM | POA: Diagnosis not present
